# Patient Record
Sex: Male | Born: 1940
Health system: Southern US, Community
[De-identification: ages and names within clinical notes are randomized; demographics above are authoritative.]

## PROBLEM LIST (undated history)

## (undated) DIAGNOSIS — C801 Malignant (primary) neoplasm, unspecified: Secondary | ICD-10-CM

## (undated) DIAGNOSIS — H9190 Unspecified hearing loss, unspecified ear: Secondary | ICD-10-CM

## (undated) DIAGNOSIS — I998 Other disorder of circulatory system: Secondary | ICD-10-CM

---

## 2008-02-21 ENCOUNTER — Inpatient Hospital Stay (HOSPITAL_COMMUNITY): Admission: AD | Admit: 2008-02-21 | Discharge: 2008-03-01 | Payer: Self-pay | Admitting: Gastroenterology

## 2008-02-24 ENCOUNTER — Encounter (INDEPENDENT_AMBULATORY_CARE_PROVIDER_SITE_OTHER): Payer: Self-pay | Admitting: Surgery

## 2008-03-01 ENCOUNTER — Ambulatory Visit: Payer: Self-pay | Admitting: Hematology and Oncology

## 2008-03-08 LAB — CBC WITH DIFFERENTIAL/PLATELET
BASO%: 1.4 % (ref 0.0–2.0)
EOS%: 4.7 % (ref 0.0–7.0)
HCT: 39.4 % (ref 38.7–49.9)
LYMPH%: 39.8 % (ref 14.0–48.0)
MCH: 32.1 pg (ref 28.0–33.4)
MCHC: 32.9 g/dL (ref 32.0–35.9)
MCV: 97.7 fL (ref 81.6–98.0)
MONO#: 0.5 10*3/uL (ref 0.1–0.9)
MONO%: 9.5 % (ref 0.0–13.0)
NEUT%: 44.6 % (ref 40.0–75.0)
Platelets: 222 10*3/uL (ref 145–400)
RBC: 4.03 10*6/uL — ABNORMAL LOW (ref 4.20–5.71)
WBC: 5 10*3/uL (ref 4.0–10.0)

## 2008-03-08 LAB — COMPREHENSIVE METABOLIC PANEL
ALT: 34 U/L (ref 0–53)
Alkaline Phosphatase: 157 U/L — ABNORMAL HIGH (ref 39–117)
CO2: 23 mEq/L (ref 19–32)
Creatinine, Ser: 1.24 mg/dL (ref 0.40–1.50)
Sodium: 144 mEq/L (ref 135–145)
Total Bilirubin: 0.5 mg/dL (ref 0.3–1.2)
Total Protein: 6.8 g/dL (ref 6.0–8.3)

## 2008-03-08 LAB — CEA: CEA: 1.8 ng/mL (ref 0.0–5.0)

## 2008-07-02 ENCOUNTER — Ambulatory Visit: Payer: Self-pay | Admitting: Hematology and Oncology

## 2008-12-09 ENCOUNTER — Inpatient Hospital Stay (HOSPITAL_COMMUNITY): Admission: EM | Admit: 2008-12-09 | Discharge: 2008-12-11 | Payer: Self-pay | Admitting: Emergency Medicine

## 2009-02-12 ENCOUNTER — Encounter: Payer: Self-pay | Admitting: Internal Medicine

## 2009-02-12 ENCOUNTER — Ambulatory Visit: Payer: Self-pay | Admitting: Internal Medicine

## 2009-02-12 ENCOUNTER — Inpatient Hospital Stay (HOSPITAL_COMMUNITY): Admission: EM | Admit: 2009-02-12 | Discharge: 2009-02-27 | Payer: Self-pay | Admitting: Emergency Medicine

## 2009-02-14 ENCOUNTER — Encounter: Payer: Self-pay | Admitting: Internal Medicine

## 2009-02-14 ENCOUNTER — Encounter (INDEPENDENT_AMBULATORY_CARE_PROVIDER_SITE_OTHER): Payer: Self-pay | Admitting: Neurosurgery

## 2009-02-26 ENCOUNTER — Ambulatory Visit: Payer: Self-pay | Admitting: Physical Medicine & Rehabilitation

## 2009-02-27 ENCOUNTER — Inpatient Hospital Stay (HOSPITAL_COMMUNITY)
Admission: RE | Admit: 2009-02-27 | Discharge: 2009-03-15 | Payer: Self-pay | Admitting: Physical Medicine & Rehabilitation

## 2009-03-02 ENCOUNTER — Ambulatory Visit: Payer: Self-pay | Admitting: Physical Medicine & Rehabilitation

## 2009-03-18 ENCOUNTER — Encounter
Admission: RE | Admit: 2009-03-18 | Discharge: 2009-06-16 | Payer: Self-pay | Admitting: Physical Medicine & Rehabilitation

## 2009-03-19 ENCOUNTER — Emergency Department (HOSPITAL_COMMUNITY): Admission: EM | Admit: 2009-03-19 | Discharge: 2009-03-19 | Payer: Self-pay | Admitting: Family Medicine

## 2009-04-03 DIAGNOSIS — I6200 Nontraumatic subdural hemorrhage, unspecified: Secondary | ICD-10-CM

## 2009-04-03 DIAGNOSIS — C189 Malignant neoplasm of colon, unspecified: Secondary | ICD-10-CM | POA: Insufficient documentation

## 2009-04-03 DIAGNOSIS — H5702 Anisocoria: Secondary | ICD-10-CM

## 2009-04-03 DIAGNOSIS — I498 Other specified cardiac arrhythmias: Secondary | ICD-10-CM

## 2009-04-03 DIAGNOSIS — F1010 Alcohol abuse, uncomplicated: Secondary | ICD-10-CM | POA: Insufficient documentation

## 2009-04-04 ENCOUNTER — Ambulatory Visit: Payer: Self-pay | Admitting: Internal Medicine

## 2009-04-04 DIAGNOSIS — R609 Edema, unspecified: Secondary | ICD-10-CM

## 2009-04-17 ENCOUNTER — Encounter
Admission: RE | Admit: 2009-04-17 | Discharge: 2009-07-16 | Payer: Self-pay | Admitting: Physical Medicine & Rehabilitation

## 2009-04-19 ENCOUNTER — Ambulatory Visit: Payer: Self-pay | Admitting: Physical Medicine & Rehabilitation

## 2009-07-09 ENCOUNTER — Encounter
Admission: RE | Admit: 2009-07-09 | Discharge: 2009-07-09 | Payer: Self-pay | Admitting: Physical Medicine & Rehabilitation

## 2010-10-19 ENCOUNTER — Encounter: Payer: Self-pay | Admitting: Hematology and Oncology

## 2010-12-25 IMAGING — CT CT HEAD W/O CM
1 of 2 series · 13 of 30 positions shown, 17 images · non-contrast
Comparison: None

CLINICAL DATA: Headache and weakness.

CT HEAD WITHOUT CONTRAST
TECHNIQUE: Contiguous axial images were obtained from the base of
the skull through the vertex without contrast.

[Series 2: brain · axial · 0.47mm/px · z∈[+168,+298]mm · 13 of 44 slices shown, 17 images]
[im 4/44  brain]
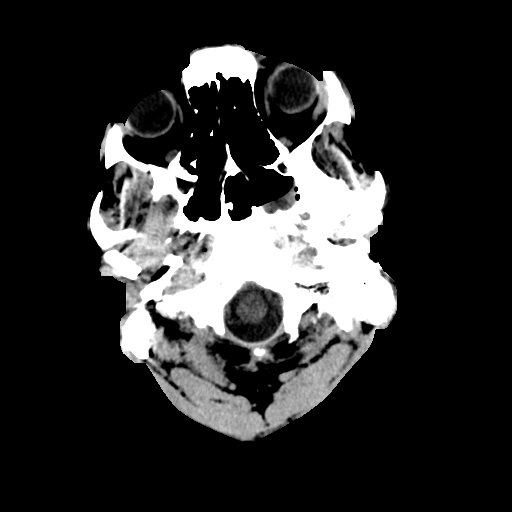
[im 4/44  bone]
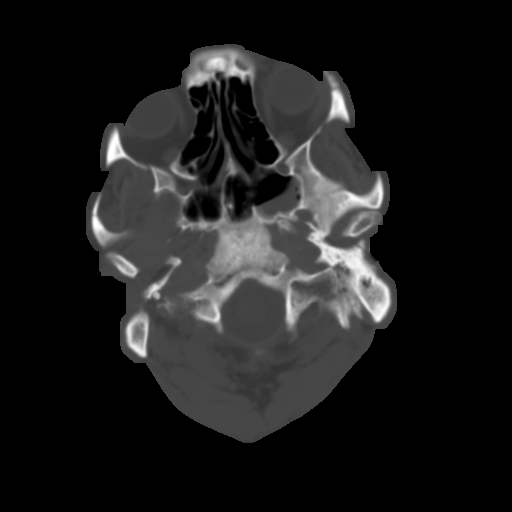
[im 7/44  brain]
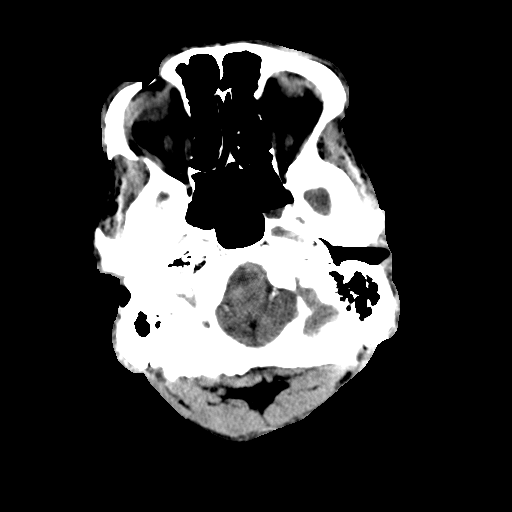
[im 10/44  brain]
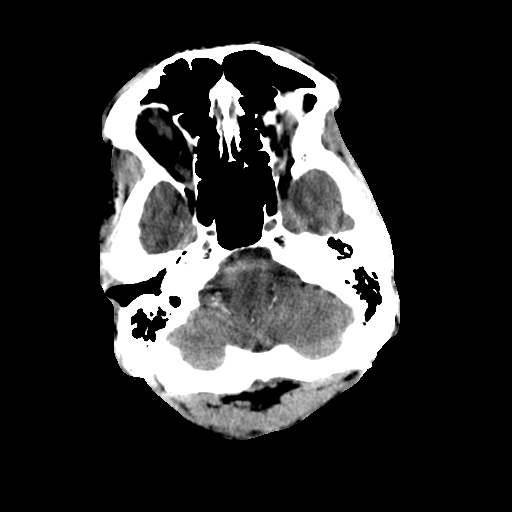
[im 13/44  brain]
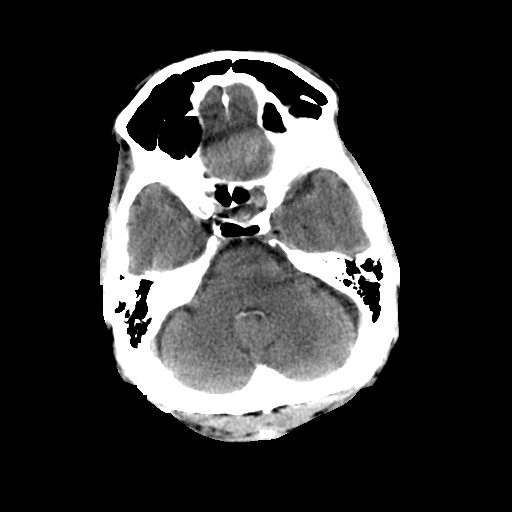
[im 16/44  brain]
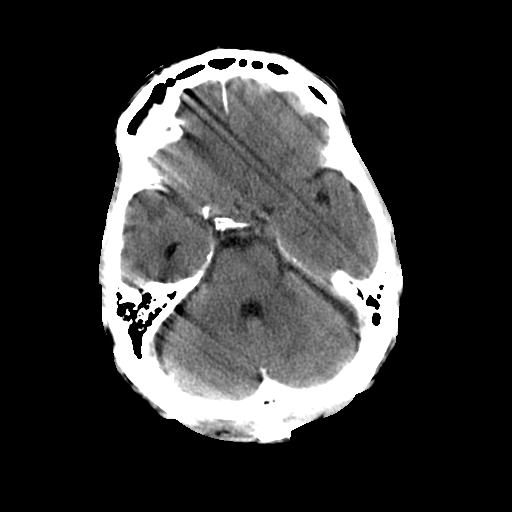
[im 16/44  bone]
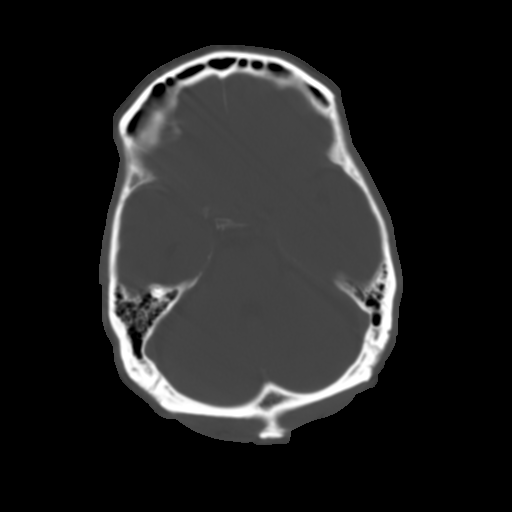
[im 19/44  brain]
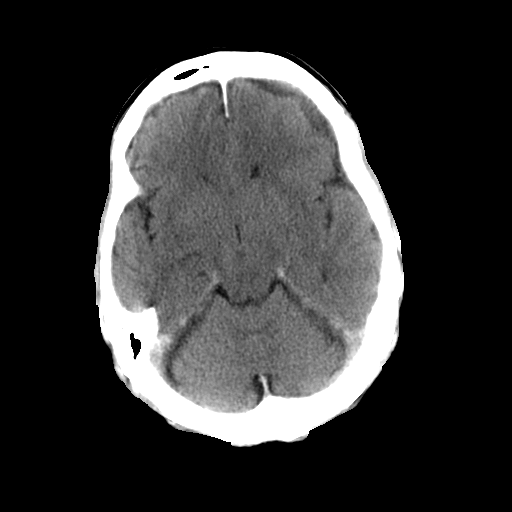
[im 22/44  brain]
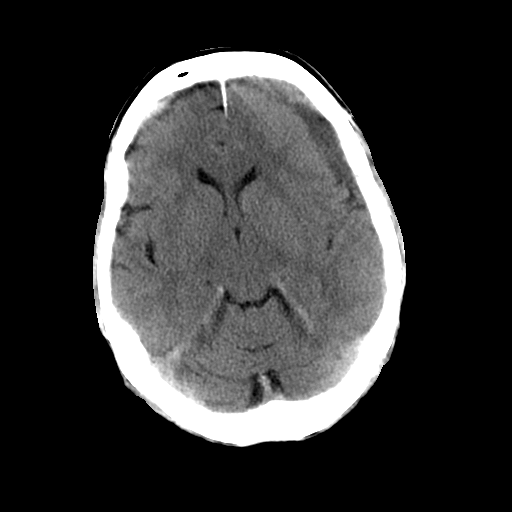
[im 25/44  brain]
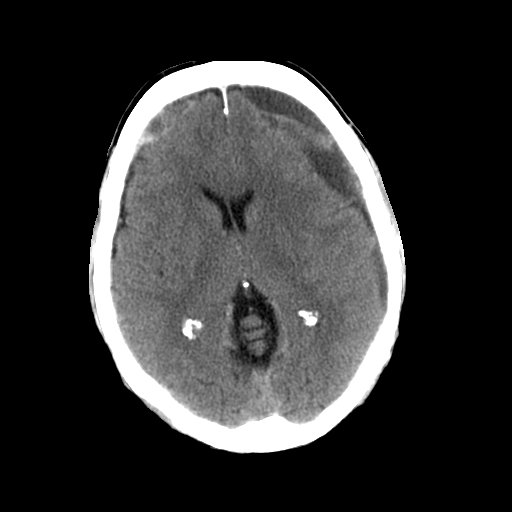
[im 28/44  brain]
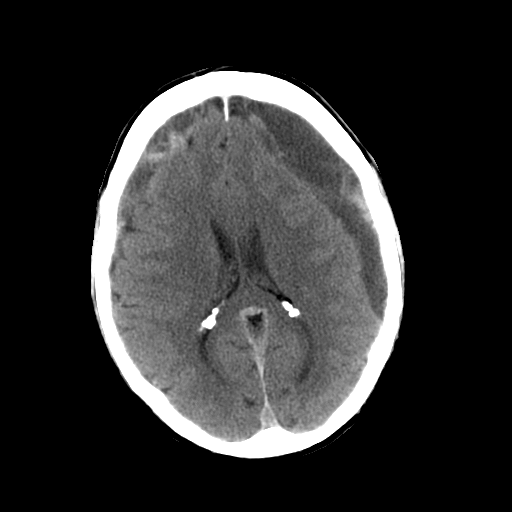
[im 28/44  bone]
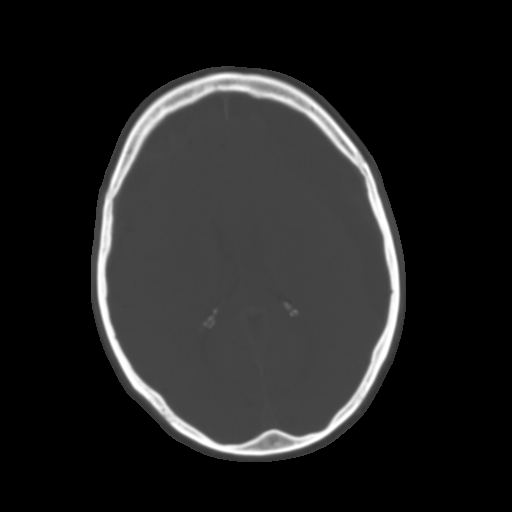
[im 31/44  brain]
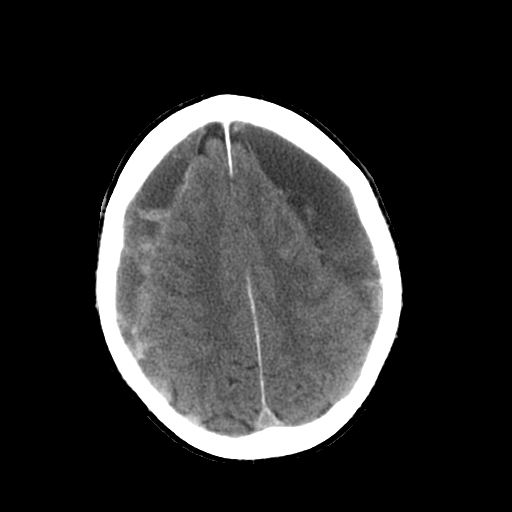
[im 34/44  brain]
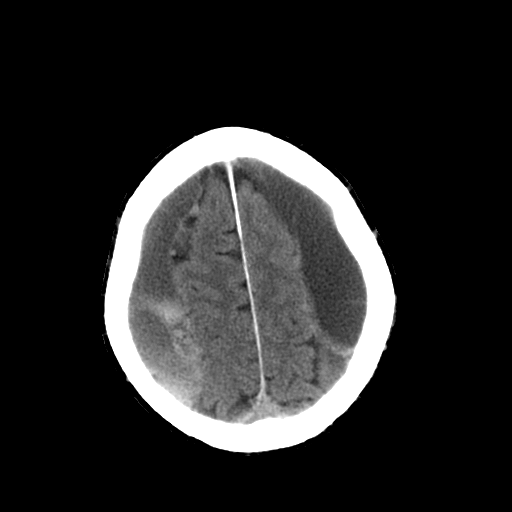
[im 37/44  brain]
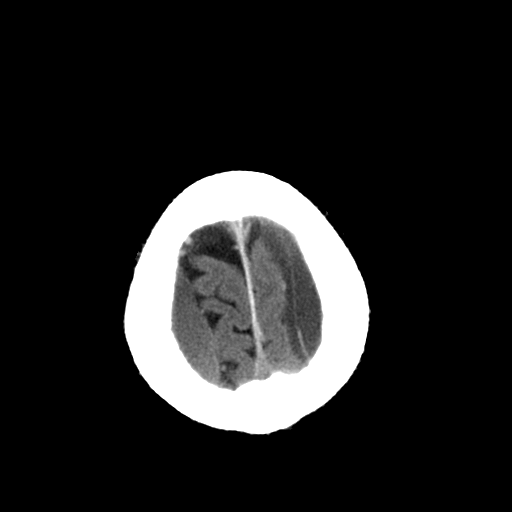
[im 40/44  brain]
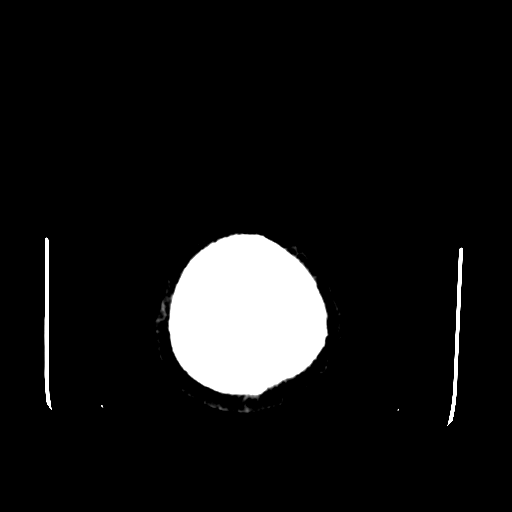
[im 40/44  bone]
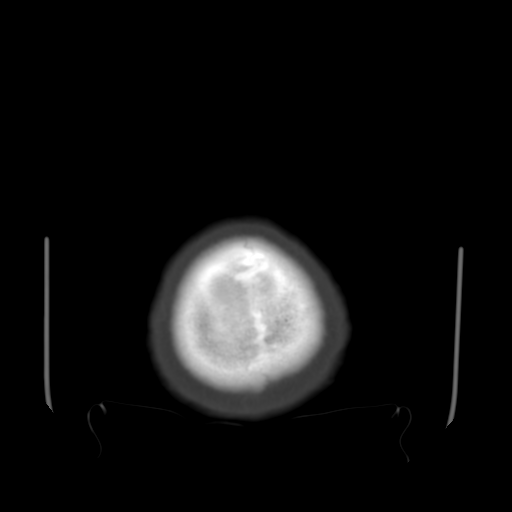

[13 of 30 positions shown; findings below may reference images not displayed]

FINDINGS: There are large bilateral subdural fluid collections with
remote, subacute and acute blood.  There is fairly equal
compression of the brain.  The ventricles are in the midline and
are somewhat compressed for age.  The sulci are also compressed.
There is no downward transtentorial herniation with CSF spaces
around the brainstem maintained.  No intracranial mass lesions.

The bony calvarium is intact.  The visualized paranasal sinuses
mastoid air cells are clear except for a small amount of fluid in
the left half of the sphenoid sinus.

There is a fracture of the right zygoma noted.  This was noted
along with multiple other facial bone fractures on the previous
maxillofacial CT.
IMPRESSION: Large bilateral subdural hematomas with differing ages of blood.
These were not present on the prior maxillofacial CT from
12/09/2008. Fairly equal compression of the ventricles bilaterally.
No downward transtentorial herniation.

## 2010-12-31 IMAGING — CT CT HEAD W/O CM
1 of 2 series · 13 of 30 positions shown, 17 images · non-contrast
Comparison: Head CT scan 02/12/2009.

CLINICAL DATA: Bilateral subdural hematomas.

CT HEAD WITHOUT CONTRAST
TECHNIQUE: Contiguous axial images were obtained from the base of
the skull through the vertex without contrast.

[Series 2: brain · axial · 0.49mm/px · z∈[+123,+255]mm · 13 of 28 slices shown, 17 images]
[im 2/28  brain]
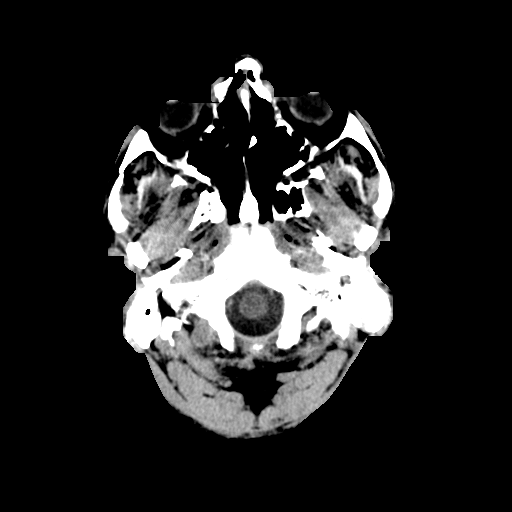
[im 2/28  bone]
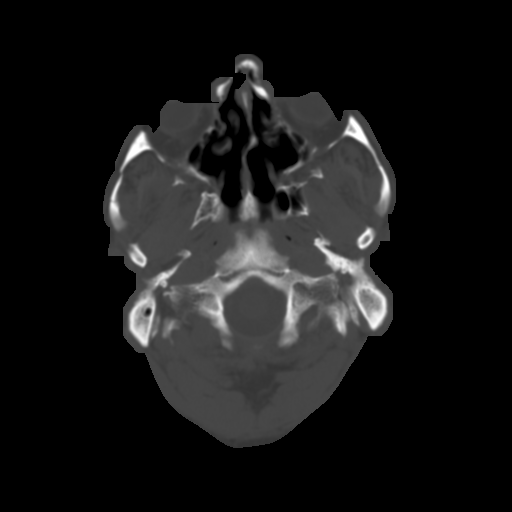
[im 4/28  brain]
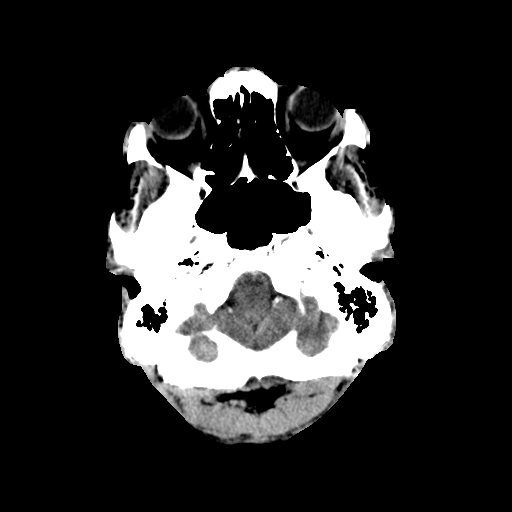
[im 6/28  brain]
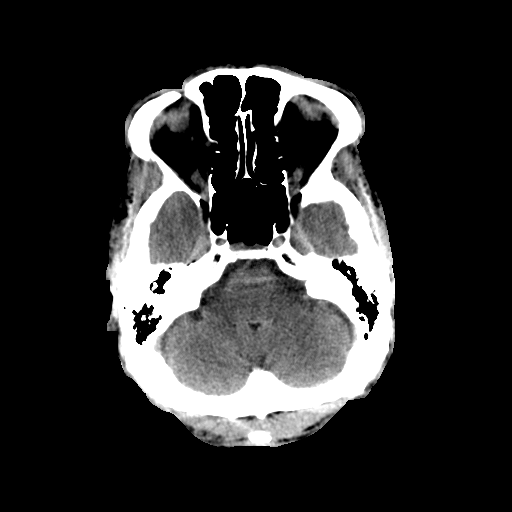
[im 8/28  brain]
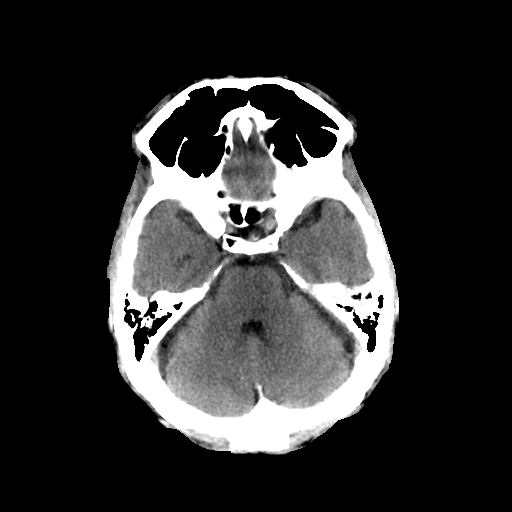
[im 10/28  brain]
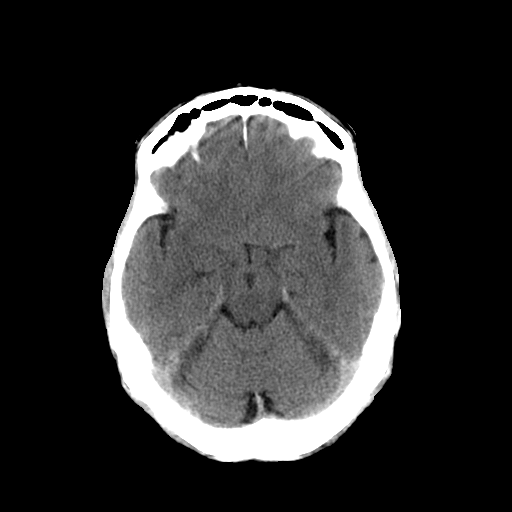
[im 10/28  bone]
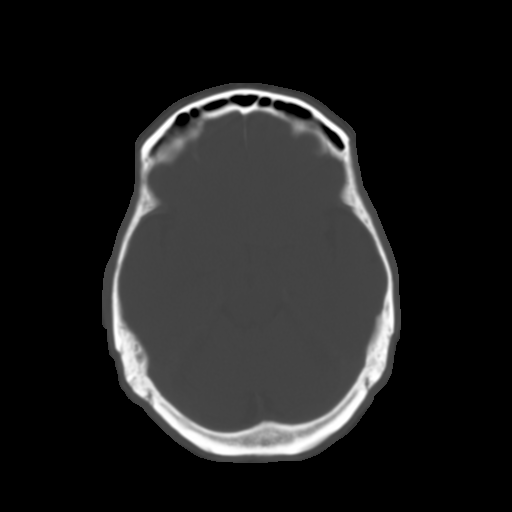
[im 12/28  brain]
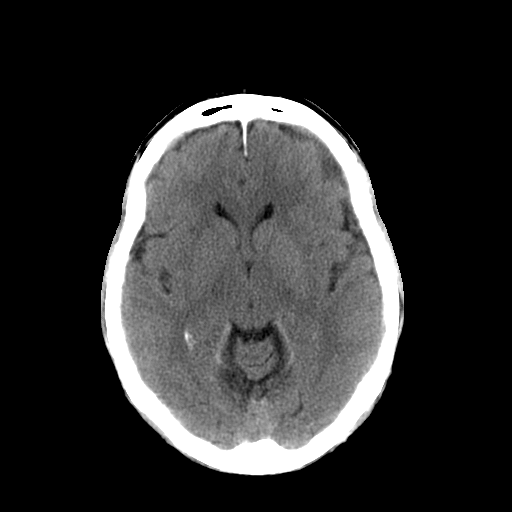
[im 14/28  brain]
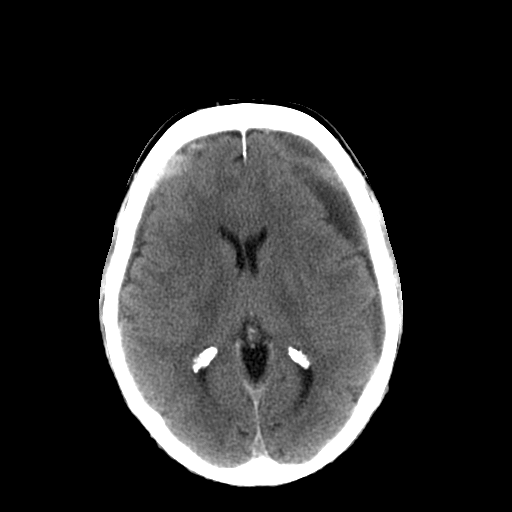
[im 16/28  brain]
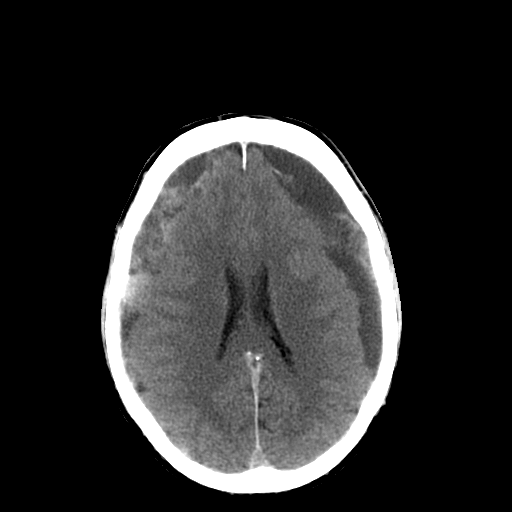
[im 18/28  brain]
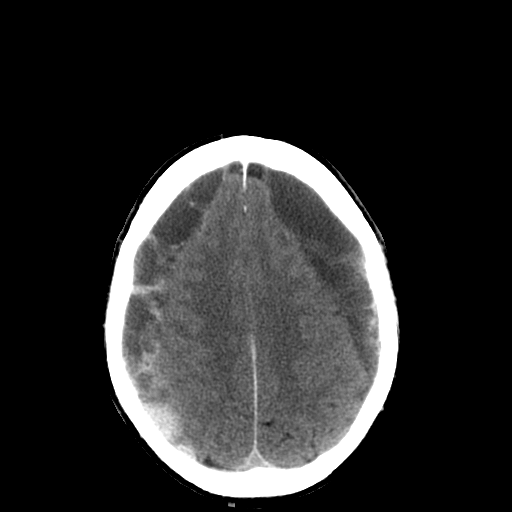
[im 18/28  bone]
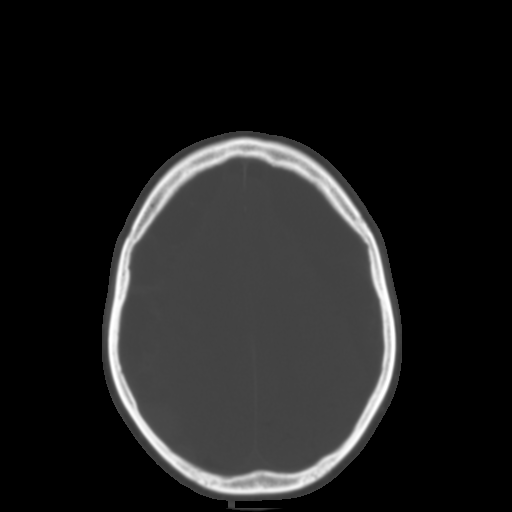
[im 20/28  brain]
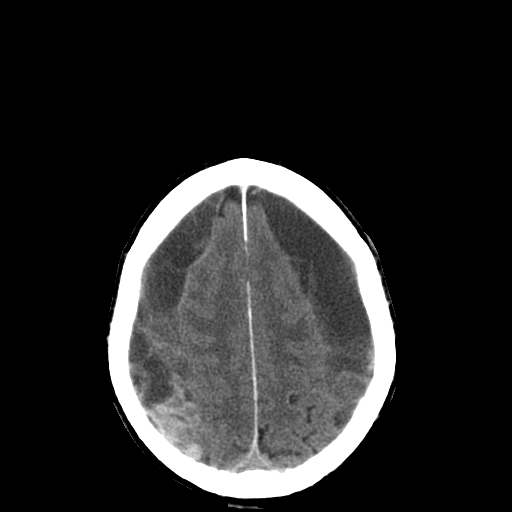
[im 22/28  brain]
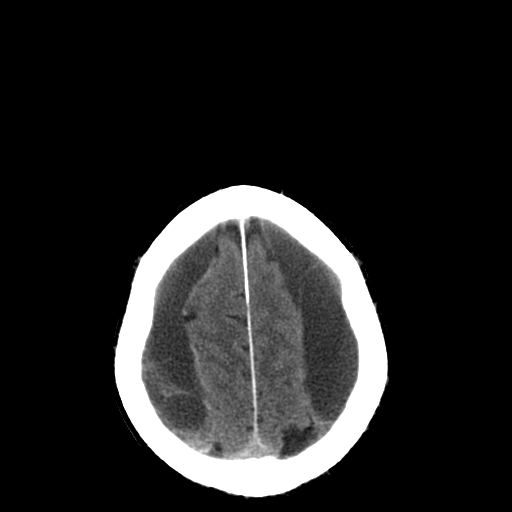
[im 24/28  brain]
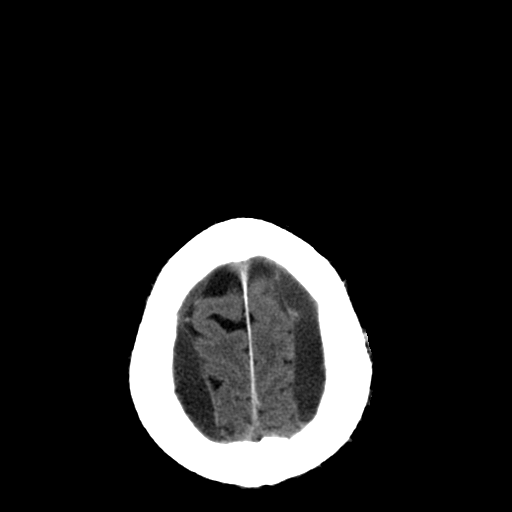
[im 26/28  brain]
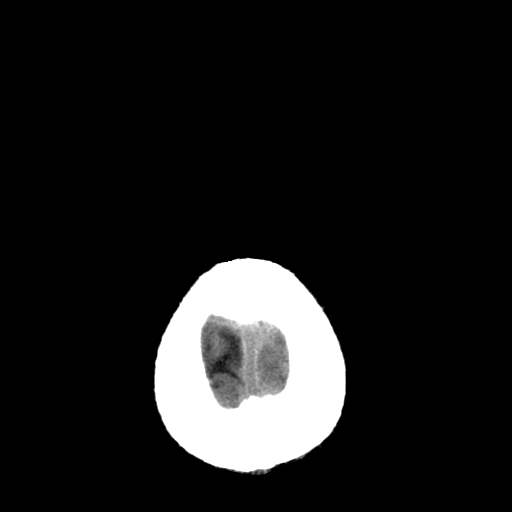
[im 26/28  bone]
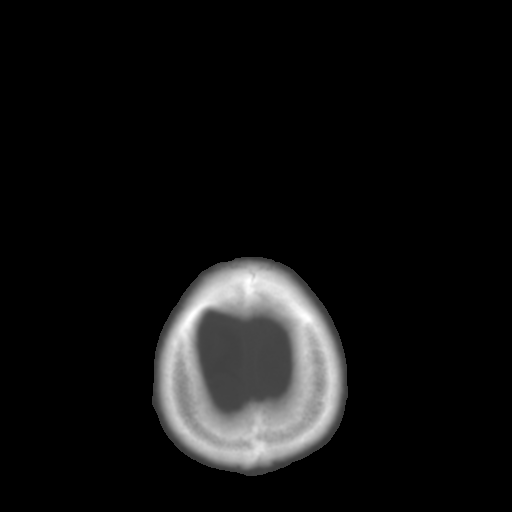

[13 of 30 positions shown; findings below may reference images not displayed]

FINDINGS: Large bilateral subdural collections with both low and
high attenuation components are again seen.  Volume of high
attenuation material appears greater on the right although the left-
sided collection appears slightly larger overall.  The collections
do not appear markedly changed in size and create mass effect in
both cerebral hemispheres.  No midline shift or hydrocephalus.
Basal cisterns remain open.  Facial fractures again noted.
IMPRESSION: No marked change in large bilateral acute on chronic subdural
hematomas.

## 2010-12-31 IMAGING — CR DG CHEST 1V PORT
1 series · 1 of 1 positions shown · non-contrast
Comparison: 12/10/2008

CLINICAL DATA: Central line placement.  Bilateral subdural
hematomas.

PORTABLE CHEST - 1 VIEW

[AP]
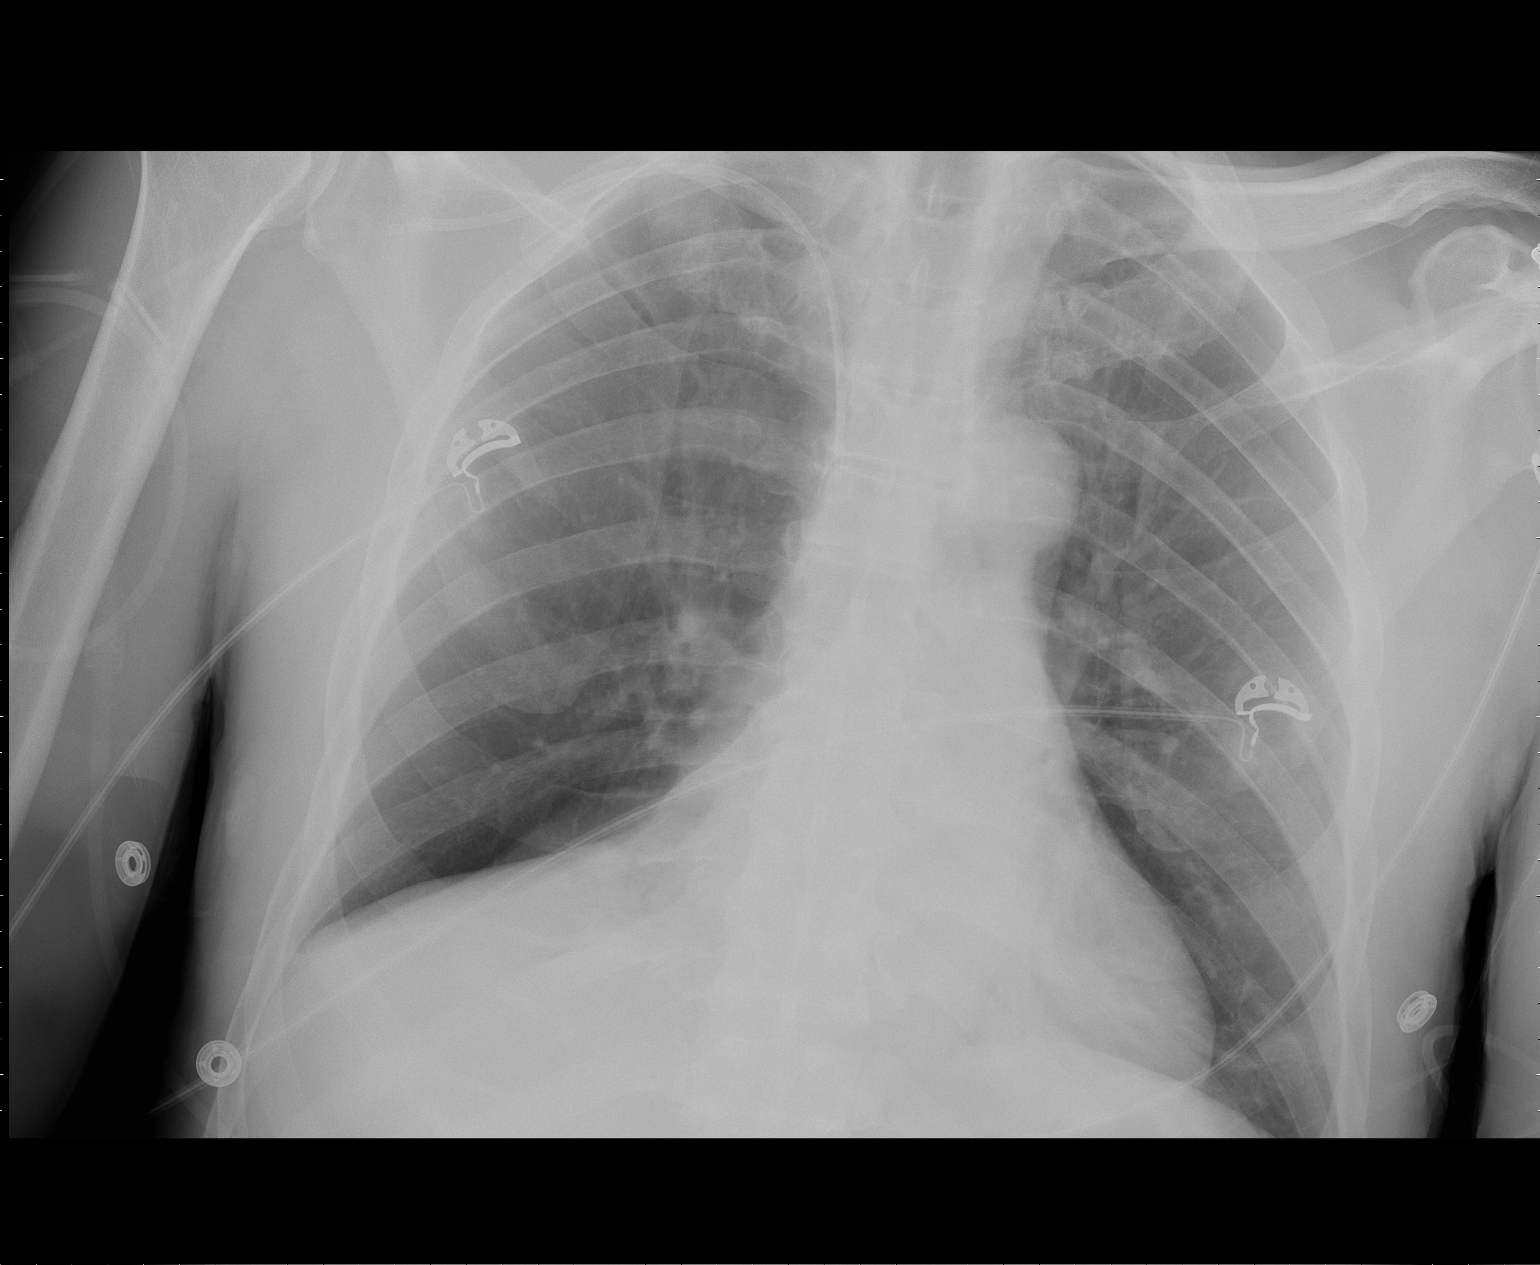

[1 of 1 positions shown; findings below may reference images not displayed]

FINDINGS: The right subclavian central venous catheter tip is in
the mid SVC.  No complicating features such as a pneumothorax.
Right middle lobe atelectasis is suspected.  No edema or effusions.
IMPRESSION: 1.  Right subclavian central venous catheter in good position
without complicating features.
2.  Probable right middle lobe atelectasis.

## 2011-01-05 LAB — COMPREHENSIVE METABOLIC PANEL
ALT: 71 U/L — ABNORMAL HIGH (ref 0–53)
Alkaline Phosphatase: 247 U/L — ABNORMAL HIGH (ref 39–117)
CO2: 27 mEq/L (ref 19–32)
Chloride: 106 mEq/L (ref 96–112)
GFR calc non Af Amer: >60 mL/min (ref >60)
Glucose, Bld: 108 mg/dL — ABNORMAL HIGH (ref 70–99)
Potassium: 4 mEq/L (ref 3.5–5.1)
Sodium: 141 mEq/L (ref 135–145)
Total Bilirubin: 0.8 mg/dL (ref 0.3–1.2)

## 2011-01-05 LAB — DIFFERENTIAL
Basophils Absolute: 0.1 10*3/uL (ref 0.0–0.1)
Basophils Relative: 1 % (ref 0–1)
Eosinophils Absolute: 0.2 10*3/uL (ref 0.0–0.7)
Eosinophils Relative: 1 % (ref 0–5)
Eosinophils Relative: 3 % (ref 0–5)
Lymphocytes Relative: 12 % (ref 12–46)
Lymphocytes Relative: 19 % (ref 12–46)
Lymphs Abs: 1.3 10*3/uL (ref 0.7–4.0)
Lymphs Abs: 1.5 10*3/uL (ref 0.7–4.0)
Monocytes Absolute: 1.1 10*3/uL — ABNORMAL HIGH (ref 0.1–1.0)
Monocytes Relative: 14 % — ABNORMAL HIGH (ref 3–12)
Neutro Abs: 5.1 10*3/uL (ref 1.7–7.7)
Neutro Abs: 8.9 10*3/uL — ABNORMAL HIGH (ref 1.7–7.7)
Neutrophils Relative %: 63 % (ref 43–77)

## 2011-01-05 LAB — BASIC METABOLIC PANEL
BUN: 12 mg/dL (ref 6–23)
CO2: 30 mEq/L (ref 19–32)
CO2: 27 mEq/L (ref 19–32)
Calcium: 10.3 mg/dL (ref 8.4–10.5)
Calcium: 10 mg/dL (ref 8.4–10.5)
Calcium: 10.1 mg/dL (ref 8.4–10.5)
Chloride: 111 mEq/L (ref 96–112)
Creatinine, Ser: 1.12 mg/dL (ref 0.4–1.5)
Creatinine, Ser: 1.03 mg/dL (ref 0.4–1.5)
Creatinine, Ser: 1.2 mg/dL (ref 0.4–1.5)
GFR calc Af Amer: >60 mL/min (ref >60)
GFR calc Af Amer: >60 mL/min (ref >60)
GFR calc non Af Amer: >60 mL/min (ref >60)
GFR calc non Af Amer: >60 mL/min (ref >60)
Glucose, Bld: 86 mg/dL (ref 70–99)
Glucose, Bld: 96 mg/dL (ref 70–99)
Potassium: 3.5 mEq/L (ref 3.5–5.1)
Sodium: 144 mEq/L (ref 135–145)
Sodium: 144 mEq/L (ref 135–145)

## 2011-01-05 LAB — CBC
HCT: 35 % — ABNORMAL LOW (ref 39.0–52.0)
HCT: 36.7 % — ABNORMAL LOW (ref 39.0–52.0)
Hemoglobin: 11.9 g/dL — ABNORMAL LOW (ref 13.0–17.0)
Hemoglobin: 12.3 g/dL — ABNORMAL LOW (ref 13.0–17.0)
Hemoglobin: 12.9 g/dL — ABNORMAL LOW (ref 13.0–17.0)
MCHC: 33.3 g/dL (ref 30.0–36.0)
MCHC: 33.4 g/dL (ref 30.0–36.0)
MCHC: 33.7 g/dL (ref 30.0–36.0)
MCV: 98.3 fL (ref 78.0–100.0)
MCV: 98.8 fL (ref 78.0–100.0)
Platelets: 196 10*3/uL (ref 150–400)
Platelets: 227 10*3/uL (ref 150–400)
Platelets: 264 10*3/uL (ref 150–400)
RBC: 3.57 MIL/uL — ABNORMAL LOW (ref 4.22–5.81)
RBC: 3.72 MIL/uL — ABNORMAL LOW (ref 4.22–5.81)
RBC: 3.89 MIL/uL — ABNORMAL LOW (ref 4.22–5.81)
RDW: 14.3 % (ref 11.5–15.5)
RDW: 14 % (ref 11.5–15.5)
WBC: 7.6 10*3/uL (ref 4.0–10.5)
WBC: 8.1 10*3/uL (ref 4.0–10.5)

## 2011-01-05 LAB — BRAIN NATRIURETIC PEPTIDE: Pro B Natriuretic peptide (BNP): <30 pg/mL (ref 0.0–100.0)

## 2011-01-05 LAB — URINALYSIS, ROUTINE W REFLEX MICROSCOPIC
Glucose, UA: NEGATIVE mg/dL
Hgb urine dipstick: NEGATIVE
Ketones, ur: 15 mg/dL — AB
pH: 5 (ref 5.0–8.0)

## 2011-01-05 LAB — URINE CULTURE
Colony Count: NO GROWTH
Culture: NO GROWTH

## 2011-01-05 LAB — POCT I-STAT, CHEM 8
Calcium, Ion: 1.34 mmol/L — ABNORMAL HIGH (ref 1.12–1.32)
HCT: 38 % — ABNORMAL LOW (ref 39.0–52.0)
TCO2: 30 mmol/L (ref 0–100)

## 2011-01-05 LAB — PREALBUMIN: Prealbumin: 20.4 mg/dL (ref 18.0–45.0)

## 2011-01-06 LAB — BASIC METABOLIC PANEL
BUN: 10 mg/dL (ref 6–23)
BUN: 10 mg/dL (ref 6–23)
BUN: 11 mg/dL (ref 6–23)
BUN: 9 mg/dL (ref 6–23)
CO2: 20 mEq/L (ref 19–32)
CO2: 20 mEq/L (ref 19–32)
CO2: 21 mEq/L (ref 19–32)
CO2: 23 mEq/L (ref 19–32)
Calcium: 9 mg/dL (ref 8.4–10.5)
Calcium: 9.1 mg/dL (ref 8.4–10.5)
Calcium: 9.4 mg/dL (ref 8.4–10.5)
Calcium: 9.8 mg/dL (ref 8.4–10.5)
Chloride: 108 mEq/L (ref 96–112)
Chloride: 111 mEq/L (ref 96–112)
Chloride: 111 mEq/L (ref 96–112)
Chloride: 112 mEq/L (ref 96–112)
Creatinine, Ser: 1.04 mg/dL (ref 0.4–1.5)
Creatinine, Ser: 1.05 mg/dL (ref 0.4–1.5)
Creatinine, Ser: 1.11 mg/dL (ref 0.4–1.5)
Creatinine, Ser: 1.24 mg/dL (ref 0.4–1.5)
GFR calc Af Amer: >60 mL/min (ref >60)
GFR calc Af Amer: >60 mL/min (ref >60)
GFR calc Af Amer: >60 mL/min (ref >60)
GFR calc Af Amer: >60 mL/min (ref >60)
GFR calc non Af Amer: 58 mL/min — ABNORMAL LOW (ref >60)
GFR calc non Af Amer: >60 mL/min (ref >60)
GFR calc non Af Amer: >60 mL/min (ref >60)
GFR calc non Af Amer: >60 mL/min (ref >60)
Glucose, Bld: 106 mg/dL — ABNORMAL HIGH (ref 70–99)
Glucose, Bld: 124 mg/dL — ABNORMAL HIGH (ref 70–99)
Glucose, Bld: 85 mg/dL (ref 70–99)
Glucose, Bld: 96 mg/dL (ref 70–99)
Potassium: 3.6 mEq/L (ref 3.5–5.1)
Potassium: 3.6 mEq/L (ref 3.5–5.1)
Potassium: 4.4 mEq/L (ref 3.5–5.1)
Potassium: 4.5 mEq/L (ref 3.5–5.1)
Sodium: 138 mEq/L (ref 135–145)
Sodium: 140 mEq/L (ref 135–145)
Sodium: 140 mEq/L (ref 135–145)
Sodium: 141 mEq/L (ref 135–145)

## 2011-01-06 LAB — DIFFERENTIAL
Basophils Absolute: 0 10*3/uL (ref 0.0–0.1)
Basophils Absolute: 0 10*3/uL (ref 0.0–0.1)
Basophils Absolute: 0 10*3/uL (ref 0.0–0.1)
Basophils Absolute: 0 10*3/uL (ref 0.0–0.1)
Basophils Relative: 0 % (ref 0–1)
Basophils Relative: 0 % (ref 0–1)
Basophils Relative: 0 % (ref 0–1)
Basophils Relative: 1 % (ref 0–1)
Basophils Relative: 1 % (ref 0–1)
Eosinophils Absolute: 0 10*3/uL (ref 0.0–0.7)
Eosinophils Absolute: 0 10*3/uL (ref 0.0–0.7)
Eosinophils Absolute: 0.1 10*3/uL (ref 0.0–0.7)
Eosinophils Absolute: 0.2 10*3/uL (ref 0.0–0.7)
Eosinophils Absolute: 0.2 10*3/uL (ref 0.0–0.7)
Eosinophils Relative: 0 % (ref 0–5)
Eosinophils Relative: 0 % (ref 0–5)
Eosinophils Relative: 2 % (ref 0–5)
Eosinophils Relative: 2 % (ref 0–5)
Lymphocytes Relative: 15 % (ref 12–46)
Lymphocytes Relative: 29 % (ref 12–46)
Lymphocytes Relative: 4 % — ABNORMAL LOW (ref 12–46)
Lymphocytes Relative: 6 % — ABNORMAL LOW (ref 12–46)
Lymphs Abs: 0.4 10*3/uL — ABNORMAL LOW (ref 0.7–4.0)
Lymphs Abs: 0.9 10*3/uL (ref 0.7–4.0)
Lymphs Abs: 1.3 10*3/uL (ref 0.7–4.0)
Lymphs Abs: 1.7 10*3/uL (ref 0.7–4.0)
Lymphs Abs: 1.7 10*3/uL (ref 0.7–4.0)
Monocytes Absolute: 0.6 10*3/uL (ref 0.1–1.0)
Monocytes Absolute: 0.7 10*3/uL (ref 0.1–1.0)
Monocytes Absolute: 1.1 10*3/uL — ABNORMAL HIGH (ref 0.1–1.0)
Monocytes Absolute: 1.4 10*3/uL — ABNORMAL HIGH (ref 0.1–1.0)
Monocytes Relative: 10 % (ref 3–12)
Monocytes Relative: 11 % (ref 3–12)
Monocytes Relative: 13 % — ABNORMAL HIGH (ref 3–12)
Monocytes Relative: 7 % (ref 3–12)
Monocytes Relative: 8 % (ref 3–12)
Neutro Abs: 11.7 10*3/uL — ABNORMAL HIGH (ref 1.7–7.7)
Neutro Abs: 3.1 10*3/uL (ref 1.7–7.7)
Neutro Abs: 3.4 10*3/uL (ref 1.7–7.7)
Neutro Abs: 5.8 10*3/uL (ref 1.7–7.7)
Neutro Abs: 9.3 10*3/uL — ABNORMAL HIGH (ref 1.7–7.7)
Neutrophils Relative %: 57 % (ref 43–77)
Neutrophils Relative %: 58 % (ref 43–77)
Neutrophils Relative %: 69 % (ref 43–77)
Neutrophils Relative %: 84 % — ABNORMAL HIGH (ref 43–77)
Neutrophils Relative %: 89 % — ABNORMAL HIGH (ref 43–77)

## 2011-01-06 LAB — CBC
HCT: 35 % — ABNORMAL LOW (ref 39.0–52.0)
HCT: 38.4 % — ABNORMAL LOW (ref 39.0–52.0)
HCT: 39.3 % (ref 39.0–52.0)
HCT: 39.7 % (ref 39.0–52.0)
Hemoglobin: 11.7 g/dL — ABNORMAL LOW (ref 13.0–17.0)
Hemoglobin: 13.1 g/dL (ref 13.0–17.0)
Hemoglobin: 13.4 g/dL (ref 13.0–17.0)
Hemoglobin: 13.5 g/dL (ref 13.0–17.0)
MCHC: 33.4 g/dL (ref 30.0–36.0)
MCHC: 34 g/dL (ref 30.0–36.0)
MCHC: 34.1 g/dL (ref 30.0–36.0)
MCHC: 34.2 g/dL (ref 30.0–36.0)
MCV: 98.1 fL (ref 78.0–100.0)
MCV: 98.2 fL (ref 78.0–100.0)
MCV: 98.7 fL (ref 78.0–100.0)
MCV: 98.9 fL (ref 78.0–100.0)
Platelets: 150 10*3/uL (ref 150–400)
Platelets: 151 10*3/uL (ref 150–400)
Platelets: 162 10*3/uL (ref 150–400)
Platelets: 165 10*3/uL (ref 150–400)
Platelets: 166 10*3/uL (ref 150–400)
RBC: 3.55 MIL/uL — ABNORMAL LOW (ref 4.22–5.81)
RBC: 3.91 MIL/uL — ABNORMAL LOW (ref 4.22–5.81)
RBC: 3.98 MIL/uL — ABNORMAL LOW (ref 4.22–5.81)
RBC: 4.04 MIL/uL — ABNORMAL LOW (ref 4.22–5.81)
RBC: 4.59 MIL/uL (ref 4.22–5.81)
RDW: 14.3 % (ref 11.5–15.5)
RDW: 14.4 % (ref 11.5–15.5)
RDW: 14.5 % (ref 11.5–15.5)
RDW: 14.7 % (ref 11.5–15.5)
WBC: 10.4 10*3/uL (ref 4.0–10.5)
WBC: 14 10*3/uL — ABNORMAL HIGH (ref 4.0–10.5)
WBC: 5.4 10*3/uL (ref 4.0–10.5)
WBC: 5.9 10*3/uL (ref 4.0–10.5)
WBC: 8.4 10*3/uL (ref 4.0–10.5)

## 2011-01-06 LAB — T4, FREE: Free T4: 0.85 ng/dL (ref 0.80–1.80)

## 2011-01-06 LAB — POCT I-STAT, CHEM 8
Calcium, Ion: 1.2 mmol/L (ref 1.12–1.32)
Chloride: 111 mEq/L (ref 96–112)
Glucose, Bld: 81 mg/dL (ref 70–99)
HCT: 47 % (ref 39.0–52.0)
Hemoglobin: 16 g/dL (ref 13.0–17.0)

## 2011-01-06 LAB — TYPE AND SCREEN: Antibody Screen: NEGATIVE

## 2011-01-06 LAB — HEMOGLOBIN A1C
Hgb A1c MFr Bld: 5 % (ref 4.6–6.1)
Mean Plasma Glucose: 97 mg/dL

## 2011-01-06 LAB — ABO/RH: ABO/RH(D): O POS

## 2011-01-06 LAB — PROTIME-INR
INR: 0.9 (ref 0.00–1.49)
Prothrombin Time: 12.3 seconds (ref 11.6–15.2)

## 2011-01-06 LAB — TSH: TSH: 1.361 u[IU]/mL (ref 0.350–4.500)

## 2011-01-06 LAB — APTT: aPTT: 26 seconds (ref 24–37)

## 2011-01-06 LAB — LIPID PANEL
Cholesterol: 193 mg/dL (ref 0–200)
HDL: 96 mg/dL (ref >39)

## 2011-01-08 LAB — BASIC METABOLIC PANEL
BUN: 12 mg/dL (ref 6–23)
Chloride: 108 mEq/L (ref 96–112)
Glucose, Bld: 96 mg/dL (ref 70–99)
Potassium: 3.5 mEq/L (ref 3.5–5.1)
Sodium: 139 mEq/L (ref 135–145)

## 2011-01-08 LAB — CBC
HCT: 32.8 % — ABNORMAL LOW (ref 39.0–52.0)
HCT: 36.3 % — ABNORMAL LOW (ref 39.0–52.0)
Hemoglobin: 11.2 g/dL — ABNORMAL LOW (ref 13.0–17.0)
Hemoglobin: 12.2 g/dL — ABNORMAL LOW (ref 13.0–17.0)
MCV: 99.1 fL (ref 78.0–100.0)
MCV: 99.2 fL (ref 78.0–100.0)
Platelets: 108 10*3/uL — ABNORMAL LOW (ref 150–400)
Platelets: 123 10*3/uL — ABNORMAL LOW (ref 150–400)
RDW: 14 % (ref 11.5–15.5)
WBC: 8.3 10*3/uL (ref 4.0–10.5)
WBC: 8.6 10*3/uL (ref 4.0–10.5)

## 2011-01-08 LAB — DIFFERENTIAL
Eosinophils Absolute: 0 10*3/uL (ref 0.0–0.7)
Eosinophils Relative: 0 % (ref 0–5)
Lymphocytes Relative: 7 % — ABNORMAL LOW (ref 12–46)
Lymphs Abs: 0.6 10*3/uL — ABNORMAL LOW (ref 0.7–4.0)
Monocytes Absolute: 0.7 10*3/uL (ref 0.1–1.0)

## 2011-01-08 LAB — APTT: aPTT: 29 seconds (ref 24–37)

## 2011-02-10 NOTE — Consult Note (Signed)
NAME:  JOMARI, BARTNIK                 ACCOUNT NO.:  [id number]   MEDICAL RECORD NO.:  [id number]          PATIENT TYPE:  INP   LOCATION:  5120                         FACILITY:  [facility]   PHYSICIAN:  Jordan Hawks. Elnoria Howard, MD    DATE OF BIRTH:  11-04-1940   DATE OF CONSULTATION:  02/21/2008  DATE OF DISCHARGE:                                 CONSULTATION   REASON FOR ADMISSION:  Postprocedural nausea and vomiting and a near  obstructing descending colon mass.   PRIMARY CARE Pavel Gadd:  Lucilla Edin, MD   HISTORY OF PRESENT ILLNESS:  This is a 69 year old gentleman who was  initially evaluated in the office with complaints of diarrhea after  discussion with his primary care Andree Heeg.  Upon further evaluation in  the office, it was felt that he had overflow incontinence secondary to  malignancy as he remained persistently heme-positive.  Interestingly,  his hemoglobin remained stable even upon repeat check in the 15 range.  The patient subsequently underwent a colonoscopy on Feb 21, 2008, and  during the examination, he was noted to have a near obstructing  circumferential mass in the descending colon.  With gentle maneuvering,  the colonoscope was able to be moved through the mass, but it was  difficult to determine the exact length because of the patient's  movements and coughing.  However, the distal site of the colon cancer  was tattooed with methylene blue.  Because of this mass as well as  findings of multiple colonic polyps, which were all removed, the  procedure was prolonged.  Subsequently after the colonoscope was  withdrawn, he had a significant amount of nausea and vomiting, which  continued to persist despite observation within postprocedure lay.  Because of his persistent symptoms, he was subsequently transferred over  to the hospital for further evaluation and treatment.  After his arrival  to the hospital, it appears that his symptoms have markedly improved in  regards to the  nausea and vomiting.  However, further workup and  treatment will be pursued at this time.   PAST MEDICAL AND SURGICAL HISTORY:  Significant for hemorrhoids and  status post hemorrhoidectomy.   HOME MEDICATIONS:  None.   FAMILY HISTORY:  Noncontributory.   SOCIAL HISTORY:  Positive for one pack per day and also alcohol use of  approximately a fifth of liquor per day.   ALLERGIES:  No known drug allergies.   REVIEW OF SYSTEMS:  As stated above, in history present illnesses,  otherwise, negative.   PHYSICAL EXAMINATION:  VITAL SIGNS:  Stable.  GENERAL:  The patient is in no acute distress, alert and oriented.  HEENT:  Normocephalic and atraumatic.  Extraocular muscles intact.  NECK:  Supple.  No lymphadenopathy.  LUNGS:  Clear to auscultation bilaterally.  CARDIOVASCULAR:  Regular rate and rhythm.  ABDOMEN:  Flat, soft, nontender, and nondistended.  Positive bowel  sounds.  EXTREMITIES:  No clubbing, cyanosis, or edema.   Laboratory values are pending at this time.   IMPRESSION:  1. Near obstructing descending colon mass, most likely a colon cancer.  2. Status  post polypectomy.  3. Alcohol abuse.   PLAN:  1. At this time is to pursue further workup and treatment for his      colonic mass.  He appears to be back at his baseline at this time,      but in light of the gravity of the current findings, aggressive      intervention is required.  He has a significant alcohol history,      and he will need to be prophylaxed for DTs.  Plan is to request a      surgical consultation.  2. Prophylaxed with Ativan 1-2 mg q.2-4 h. p.r.n. IV and a CT scan of      the abdomen and pelvis with IV and p.o. contrast and await CEA      level.      Jordan Hawks Elnoria Howard, MD  Electronically Signed     PDH/MEDQ  D:  02/21/2008  T:  02/22/2008  Job:  [id number]   cc:   Brett Canales A. Cleta Alberts, M.D.
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
## 2011-02-10 NOTE — Discharge Summary (Signed)
NAME:  Eddie Hodge, Eddie Hodge                 ACCOUNT NO.:  [id number]   MEDICAL RECORD NO.:  [id number]          PATIENT TYPE:  INP   LOCATION:  5153                         FACILITY:  [facility]   PHYSICIAN:  Eddie Hodge, M.D.DATE OF BIRTH:  [birth year]   DATE OF ADMISSION:  02/21/2008  DATE OF DISCHARGE:  03/01/2008                               DISCHARGE SUMMARY   PROCEDURE:  Partial colectomy with resection of distal transverse colon,  splenic flexure, descending colon, proximal sigmoid colon with a primary  anastomosis on Feb 24, 2008.   CONSULTANTS:  Eddie A. Cornett, MD with [facility].   REASON FOR ADMISSION:  Mr. Eddie Hodge is a 70 year old black male who  presented to Dr. Haywood Hodge office with a complaint of approximately 3  months of constipation.  He stated that the past several weeks, he has  noticed diarrhea as well.  He also noted some hematochezia in the toilet  as well as on the tissue paper and mixed in the stool.  He also states  that he had been also having abdominal pain after eating.  Because of  these symptoms, Dr. Elnoria Hodge felt the patient would benefit from a  colonoscopy.  Therefore, Dr. Elnoria Hodge performed the colonoscopy in which a  large near-obstructing colon mass was found.  At this time, he took  several biopsies of this as well as biopsies of various other polyps  that he found throughout the colon.  At this time, he was admitted to  the hospital for eventual surgical resection of this colon mass.  Please  see dictated history and physical for further information.   ADMITTING DIAGNOSES:  1. Near-obstructing colon mass.  2. History of heavy alcohol use.  3. Diarrhea.  4. Constipation.   HOSPITAL COURSE:  After the patient was admitted, he was put on clear  liquid diet.  Over the next 2 days, the patient was continued on a clear  liquid diet while pathology results were pending.  At this time, we did  not feel comfortable going in to resect mass with the  pathology pending  due to the possibility of the various other  polyps that were biopsied  coming back positive for cancer as well.  On hospital day 2, pathology  came back showing adenocarcinoma of the large near-obstructing mass,  however, all the other polyps were benign.  Therefore on hospital day 3,  the patient was continuing to do well with decrease in pain and at this  time was n.p.o. since midnight and therefore at this time was taken to  the OR for an exploratory laparotomy with a partial colectomy with a  resection of the distal transverse colon, the splenic flexure, the  descending colon, and the proximal sigmoid colon with a primary  anastomosis.  The patient tolerated this procedure well.  Later that  night, the patient had pulled his NG tube and was refusing for it to be  put back in.  Therefore, on postoperative day 1, since the patient was  having active bowel sounds and was not complaining of any nausea, the  patient's  NG tube was left out and he was allowed to have ice chips with  sips of water.  On postoperative day 2, the patient continued without  complaint.  He was continued on ice chips until further bowel function  returned.  At this time, his abdomen was soft and mildly tender.  His  incision at this time was healing well with his staples still intact.  By postoperative day 3, the patient did not have any complaints.  He was  tolerating his ice chips and sips of clears and therefore he was  advanced to clear liquid diet.  Also by this day, his pathology had  returned which did confirm an adenocarcinoma of his colon.  On  postoperative day 4, the patient was tolerating his clear liquids and  had 2 bowel movements the day prior.  His abdomen was benign with his  incision being clean, dry, and intact with no erythema or drainage.  At  this time, his diet was advanced to full liquids.  By postoperative day  5, the patient was tolerating fluids and therefore was  advanced to  regular diet.  We had discussed the findings of the pathology with the  patient and at this time recommended outpatient followup with the  oncologist.  At this time, I contacted the [facility] in  which I have scheduled an appointment for the patient with Eddie Hodge to  followup for the patient's adenocarcinoma.  At this time, the patient is  considered a T3 N0 stage.  On postoperative day 6, the patient was  tolerating a diet and feeling well.  Therefore, he was stable for  discharge.   DISCHARGE DIAGNOSES:  1. Adenocarcinoma of the sigmoid colon.  2. Status post exploratory laparotomy with sigmoid colectomy.  3. History of heavy alcohol use.   DISCHARGE MEDICATIONS:  The patient does not take any home medications.  However, he was given a prescription for Vicodin 5/325 one to two  tablets q.4 h. p.r.n. pain.   DISCHARGE INSTRUCTIONS:  The patient was told that when he returns home,  he has no dietary restrictions.  He was informed to increase his  activity slowly as well as he may walk up steps.  He is also able to  shower and is not to lift anything greater than 15 pounds for the next 4  weeks.  He is also informed that he is not to drive for the next week.  He is also informed that he needs to call [facility] if he  begins to develop fevers greater than 101.5, new or severe abdominal  pain, or redness or pus-like drainage from any of his incisions.  Otherwise at this time, he is told that he need to follow up the [facility] on March 06, 2008 at 2:45 p.m. for  staple removal.  At this time, he will be informed that he needs to  followup Dr. Luisa Hodge within the next 2 weeks for further followup.  He  was also informed that he needs to follow up with Eddie Hodge at the  [facility] at [place] on March 08, 2008, with an  appointment scheduled at 10 a.m., however, he is informed to be there at  9:30 for  checking.      Eddie Cape, PA      [place]. Derrell Hodge, M.D.  Electronically Signed    KEO/MEDQ  D:  03/01/2008  T:  03/01/2008  Job:  [id number]   cc:   Eddie Hodge, M.D.  Lauretta I. Odogwu, M.D.
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
## 2011-02-10 NOTE — Assessment & Plan Note (Signed)
Mr. Eddie Hodge is back regarding his bilateral subdural hemorrhages.  He was  discharged home on March 15, 2009 with his family.  He is receiving  outpatient therapy and is doing quite well.  Sounds as if he is only  working with speech and perhaps OT at this point for his cognitive  deficits.  He is walking without device.  He report some problems  sleeping, but states that it is due to his mattress.  Denies headaches  or seizures.  He is not drinking any longer.  His moods been good.  He  denies depression.  His daughters is living with him currently.   REVIEW OF SYSTEMS:  Notable for the intermittent confusion as well as  some limb swelling at times.  Otherwise, pertinent positives are above  and full review is in the written health and history section of the  chart.   SOCIAL HISTORY:  Notable for the above.  The patient was living by  himself prior to arrival.   MEDICATIONS:  Keppra 250 mg 2 times a day.   PHYSICAL EXAMINATION:  VITAL SIGNS:  Blood pressure is 103/60, pulse 61,  and respiratory rate 18.  He is sating 98% on room air.  GENERAL:  The patient is pleasant, alert, with cues to the date.  He was  alert to place and region.  He was able to do some simple math on his  own, however with some algebraic equations, he was a bit impaired.  He  remember 2/3 words after 5 minutes.  He has fair insight awareness.  Stable to follow multi-step commands.  Mood was pleasant.  Speech was  intact.  HEART:  Regular.  CHEST:  Clear.  ABDOMEN:  Soft, nontender.  EXTREMITIES:  The patient ambulated before me with normal stride width.  He is able to walk heel-to-toe without any difficulties today.  Strength  is 5/5 in all 4 extremities.  NEUROLOGIC:  Sensory exam is grossly intact.  Cranial nerve exam is  normal.  SKIN:  Intact.   ASSESSMENT:  1. Bilateral subdural hemorrhage.  2. History of colon cancer.   PLAN:  1. The patient has done extremely well to this point.  Continue with  outpatient therapies to completion.  The patient  still presents      with some mild cognitive deficits, which should improve somewhat.      I question whether there was some baseline cognitive deficits due      to his alcohol abuse.  2. Impressed the importance of alcohol abstinence to the patient      today.  3. Wean Keppra over the next month.  Decreased to 250 mg nightly x1      month and stop.  4. I will see the patient back in about 3 months' time for followup.      He is unable to live alone, but alone still at this point nor drive      due to his higher level cognitive deficits.      Ranelle Oyster, M.D.  Electronically Signed     ZTS/MedQ  D:  04/19/2009 11:11:45  T:  04/20/2009 00:45:19  Job #:  [id number]   cc:   Hewitt Shorts, M.D.  Fax: [phone]

## 2011-02-10 NOTE — Op Note (Signed)
NAME:  Eddie Hodge, Eddie Hodge                 ACCOUNT NO.:  [id number]   MEDICAL RECORD NO.:  [id number]          PATIENT TYPE:  INP   LOCATION:  5153                         FACILITY:  [facility]   PHYSICIAN:  Clovis Pu. Cornett, M.D.DATE OF BIRTH:  04/26/1941   DATE OF PROCEDURE:  02/24/2008  DATE OF DISCHARGE:                               OPERATIVE REPORT   PREOPERATIVE DIAGNOSIS:  Descending colon/sigmoid colon cancer with near  obstruction (stage II adenocarcinoma descending colon).   POSTOPERATIVE DIAGNOSIS:  Descending colon/sigmoid colon cancer with  near obstruction.   PROCEDURE:  Partial colectomy with resection of distal transverse colon,  splenic flexure, descending colon, proximal sigmoid colon, primary  anastomosis.   SURGEON:  Maisie Fus A. Cornett, MD   ANESTHESIA:  General endotracheal anesthesia.   ESTIMATED BLOOD LOSS:  150 mL.   SPECIMEN:  Transverse colon, splenic flexure, descending colon, and  proximal sigmoid colon to pathology for evaluation with stricture noted,  margins grossly negative by gross examination.   DRAINS:  None.   INDICATIONS FOR PROCEDURE:  The patient is a 70 year old male who was  admitted by Dr. Jeani Hawking due to a colon stricture after colonoscopy.  Biopsy proven adenocarcinoma.  This was in his distal descending colon  and proximal sigmoid colon.  He presents for operative resection of this  today.  Informed consent was obtained.  Procedure was discussed with the  patient.  All options were discussed with potential complications,  bleeding, infection and anastomotic leak by obstruction, DVT, myocardial  infarction, stroke, and blood clots.  He understood the above and agreed  to proceed.   DESCRIPTION OF PROCEDURE:  The patient was brought to the operating room  and placed supine.  He was placed initially in lithotomy.  After  induction of general anesthesia, the abdomen was prepped and draped  sterile fashion and a Foley catheter was placed.   He received  antibiotics.  Midline incision was used.  We started just above the  umbilicus and continued down to his pubic symphysis.  Upon entering the  abdominal cavity, I was able to palpate the lesion.  It was in the  distal descending colon and proximal sigmoid colon and was about 7 cm in  length.  Colon was quite redundant and floppy.  He had significant  amount of sigmoid colon below this.  I went ahead and mobilized the  colon along with white line of Toldt after placing self-retaining  retractor.  Once I mobilized this, I then selected a point in the  proximal sigmoid colon above where the IMA fit into the sigmoid colon.  A GIA 75 stapling device was used throughout the colon there.  LigaSure  was used to take the mesentery.  Preserving the IMA supply to the lower  segment of the sigmoid since this was going to be used for the  anastomosis.  The mass was primarily mostly in the descending colon  distal portion.  Once I got to the proximal resection margin, I resected  this with a GIA 75 stapling device.  After doing this, I began to  mobilize  the remaining descending colon up to the splenic flexure.  While I was palpating the transverse colon distal to the middle colic  vessel in mass, the mesentery was noted.  This was at the base of the  colon.  I felt this needed to be resected as well with it.  I went ahead  and chose a point in the transverse colon just proximal to it by about 3  cm.  I decided to go ahead and divide and take the remainder of this  colon since this mass was introduced well.  The polypectomy was done  elsewhere but these were benign.  Any event I felt this needed to be  resected and include this in my resection specimen.  An additional  loader was utilized, 75 stapling device was used just distal to the  middle colic vessel and I divided the transverse colon there.  Mesentery  was taken down away including the splenic flexure and the remainder of  the  transverse, splenic flexure, and descending colon were resected and  sent as specimen separately.  The mass was located at the base of the  colon.  After doing this, we fashioned a side-to-side stapled  anastomosis of the transverse colon to the sigmoid colon.  This was done  using a GIA 75 stapling device.  The enterotomy was then closed with a  TA-60.  Stitches were placed across the anastomosis.  The mesenteric  defect was quite large and I elected not to try to close this since it  would be very difficult and I fear this would break and lead to a bowel  obstruction.  He had plenty of room between the bands of mesentery and I  left this widely opened.  The anastomosis was under no tension and was  widely patent, had excellent bleeding from all edges and had no tension  whatsoever to it.  Irrigation was used and suctioned out.  I inspected  the left upper quadrant where I mobilized the colon away from the spleen  and saw no signs of bleeding.  The mesentery was widely hemostatic.  At  this point in time, all packs that we used to retract the bowel were  removed and counted and found to be correct.  I palpated the abdominal  cavity and felt no additional packs.  At this point in time, the  retractor was removed.  NG tube was positioned in the stomach  appropriately.  Liver was palpated and I felt no masses in the liver.  Gallbladder felt normal.  The remainder of the abdominal examination  including the small bowel appeared normal.  At this point in time, we  then closed the fascia with a running #1 PDS suture.  Staple was used to  close the skin.  Dry dressings were applied.  All final counts, sponge  and instruments were found to be correct at this portion of the case.  EBL was 150 mL.  The patient was awoke, taken to recovery room in  satisfactory condition.      Thomas A. Cornett, M.D.  Electronically Signed     TAC/MEDQ  D:  02/24/2008  T:  02/25/2008  Job:  [id number]   cc:    Jordan Hawks. Elnoria Howard, MD
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
## 2011-06-24 LAB — CBC
HCT: 36.2 — ABNORMAL LOW
HCT: 40.4
MCHC: 33.5
MCV: 97.6
MCV: 98.5
Platelets: 122 — ABNORMAL LOW
Platelets: 123 — ABNORMAL LOW
RBC: 3.68 — ABNORMAL LOW
RBC: 4.11 — ABNORMAL LOW
RDW: 13.2
WBC: 6.4
WBC: 7.2

## 2011-06-24 LAB — BASIC METABOLIC PANEL
BUN: 1 — ABNORMAL LOW
BUN: 9
CO2: 21
CO2: 22
Chloride: 111
Chloride: 112
Creatinine, Ser: 0.97
Creatinine, Ser: 1.24
GFR calc Af Amer: >60
GFR calc Af Amer: >60
GFR calc non Af Amer: 58 — ABNORMAL LOW
Glucose, Bld: 121 — ABNORMAL HIGH
Potassium: 3.7
Potassium: 3.8

## 2011-06-24 LAB — CREATININE, SERUM
Creatinine, Ser: 1.08
GFR calc non Af Amer: >60

## 2011-06-25 LAB — BASIC METABOLIC PANEL
CO2: 22
CO2: 22
Calcium: 8.5
Calcium: 8.6
Creatinine, Ser: 1.01
GFR calc Af Amer: >60
GFR calc Af Amer: >60
GFR calc non Af Amer: >60
Sodium: 145

## 2011-06-25 LAB — CBC
MCHC: 34.7
RBC: 3.58 — ABNORMAL LOW

## 2020-08-21 ENCOUNTER — Encounter (HOSPITAL_COMMUNITY): Payer: Self-pay

## 2020-08-21 ENCOUNTER — Emergency Department (HOSPITAL_COMMUNITY): Payer: Medicare Other

## 2020-08-21 ENCOUNTER — Encounter (HOSPITAL_COMMUNITY)
Admission: EM | Disposition: A | Discharge status: Home Health | Payer: Self-pay | Source: Home / Self Care | Attending: Vascular Surgery

## 2020-08-21 ENCOUNTER — Other Ambulatory Visit: Payer: Self-pay

## 2020-08-21 ENCOUNTER — Inpatient Hospital Stay (HOSPITAL_COMMUNITY)
Admission: EM | Admit: 2020-08-21 | Discharge: 2020-09-02 | DRG: 271 | Disposition: A | Discharge status: Home Health | Payer: Medicare Other | Attending: Vascular Surgery | Admitting: Vascular Surgery

## 2020-08-21 ENCOUNTER — Emergency Department (HOSPITAL_COMMUNITY): Payer: Medicare Other | Admitting: Registered Nurse

## 2020-08-21 DIAGNOSIS — F17200 Nicotine dependence, unspecified, uncomplicated: Secondary | ICD-10-CM | POA: Diagnosis not present

## 2020-08-21 DIAGNOSIS — Z20822 Contact with and (suspected) exposure to covid-19: Secondary | ICD-10-CM | POA: Diagnosis present

## 2020-08-21 DIAGNOSIS — N179 Acute kidney failure, unspecified: Secondary | ICD-10-CM | POA: Diagnosis not present

## 2020-08-21 DIAGNOSIS — I745 Embolism and thrombosis of iliac artery: Secondary | ICD-10-CM | POA: Diagnosis not present

## 2020-08-21 DIAGNOSIS — R509 Fever, unspecified: Secondary | ICD-10-CM | POA: Diagnosis not present

## 2020-08-21 DIAGNOSIS — Z87820 Personal history of traumatic brain injury: Secondary | ICD-10-CM | POA: Diagnosis not present

## 2020-08-21 DIAGNOSIS — Z23 Encounter for immunization: Secondary | ICD-10-CM

## 2020-08-21 DIAGNOSIS — D72829 Elevated white blood cell count, unspecified: Secondary | ICD-10-CM | POA: Diagnosis not present

## 2020-08-21 DIAGNOSIS — I7409 Other arterial embolism and thrombosis of abdominal aorta: Secondary | ICD-10-CM | POA: Diagnosis present

## 2020-08-21 DIAGNOSIS — F1010 Alcohol abuse, uncomplicated: Secondary | ICD-10-CM | POA: Diagnosis not present

## 2020-08-21 DIAGNOSIS — G8929 Other chronic pain: Secondary | ICD-10-CM | POA: Diagnosis present

## 2020-08-21 DIAGNOSIS — R54 Age-related physical debility: Secondary | ICD-10-CM | POA: Diagnosis not present

## 2020-08-21 DIAGNOSIS — M549 Dorsalgia, unspecified: Secondary | ICD-10-CM | POA: Diagnosis present

## 2020-08-21 DIAGNOSIS — Z85038 Personal history of other malignant neoplasm of large intestine: Secondary | ICD-10-CM

## 2020-08-21 DIAGNOSIS — I70223 Atherosclerosis of native arteries of extremities with rest pain, bilateral legs: Secondary | ICD-10-CM | POA: Diagnosis not present

## 2020-08-21 DIAGNOSIS — R569 Unspecified convulsions: Secondary | ICD-10-CM | POA: Diagnosis present

## 2020-08-21 DIAGNOSIS — I70201 Unspecified atherosclerosis of native arteries of extremities, right leg: Secondary | ICD-10-CM

## 2020-08-21 DIAGNOSIS — I998 Other disorder of circulatory system: Secondary | ICD-10-CM

## 2020-08-21 HISTORY — PX: THROMBECTOMY FEMORAL ARTERY: SHX6406

## 2020-08-21 HISTORY — PX: FASCIOTOMY: SHX132

## 2020-08-21 LAB — COMPREHENSIVE METABOLIC PANEL
ALT: 15 U/L (ref 0–44)
AST: 28 U/L (ref 15–41)
Albumin: 3.7 g/dL (ref 3.5–5.0)
Alkaline Phosphatase: 91 U/L (ref 38–126)
Anion gap: 13 (ref 5–15)
BUN: 16 mg/dL (ref 8–23)
CO2: 24 mmol/L (ref 22–32)
Calcium: 9.9 mg/dL (ref 8.9–10.3)
Chloride: 102 mmol/L (ref 98–111)
Creatinine, Ser: 1.39 mg/dL — ABNORMAL HIGH (ref 0.61–1.24)
GFR, Estimated: 51 mL/min — ABNORMAL LOW (ref >60)
Glucose, Bld: 107 mg/dL — ABNORMAL HIGH (ref 70–99)
Potassium: 3.6 mmol/L (ref 3.5–5.1)
Sodium: 139 mmol/L (ref 135–145)
Total Bilirubin: 1.8 mg/dL — ABNORMAL HIGH (ref 0.3–1.2)
Total Protein: 7.4 g/dL (ref 6.5–8.1)

## 2020-08-21 LAB — CBC
HCT: 58 % — ABNORMAL HIGH (ref 39.0–52.0)
Hemoglobin: 18.8 g/dL — ABNORMAL HIGH (ref 13.0–17.0)
MCH: 33 pg (ref 26.0–34.0)
MCHC: 32.4 g/dL (ref 30.0–36.0)
MCV: 101.9 fL — ABNORMAL HIGH (ref 80.0–100.0)
Platelets: 121 10*3/uL — ABNORMAL LOW (ref 150–400)
RBC: 5.69 MIL/uL (ref 4.22–5.81)
RDW: 13.2 % (ref 11.5–15.5)
WBC: 8.2 10*3/uL (ref 4.0–10.5)
nRBC: 0 % (ref 0.0–0.2)

## 2020-08-21 LAB — I-STAT CHEM 8, ED
BUN: 19 mg/dL (ref 8–23)
Calcium, Ion: 1.18 mmol/L (ref 1.15–1.40)
Chloride: 102 mmol/L (ref 98–111)
Creatinine, Ser: 1.3 mg/dL — ABNORMAL HIGH (ref 0.61–1.24)
Glucose, Bld: 106 mg/dL — ABNORMAL HIGH (ref 70–99)
HCT: 62 % — ABNORMAL HIGH (ref 39.0–52.0)
Hemoglobin: 21.1 g/dL (ref 13.0–17.0)
Potassium: 3.7 mmol/L (ref 3.5–5.1)
Sodium: 139 mmol/L (ref 135–145)
TCO2: 24 mmol/L (ref 22–32)

## 2020-08-21 LAB — RAPID URINE DRUG SCREEN, HOSP PERFORMED
Amphetamines: NOT DETECTED
Barbiturates: NOT DETECTED
Benzodiazepines: NOT DETECTED
Cocaine: NOT DETECTED
Opiates: NOT DETECTED
Tetrahydrocannabinol: NOT DETECTED

## 2020-08-21 LAB — URINALYSIS, ROUTINE W REFLEX MICROSCOPIC
Bacteria, UA: NONE SEEN
Bilirubin Urine: NEGATIVE
Glucose, UA: NEGATIVE mg/dL
Ketones, ur: 5 mg/dL — AB
Leukocytes,Ua: NEGATIVE
Nitrite: NEGATIVE
Protein, ur: 100 mg/dL — AB
Specific Gravity, Urine: 1.023 (ref 1.005–1.030)
pH: 5 (ref 5.0–8.0)

## 2020-08-21 LAB — ETHANOL: Alcohol, Ethyl (B): <10 mg/dL (ref <10)

## 2020-08-21 LAB — DIFFERENTIAL
Abs Immature Granulocytes: 0.05 10*3/uL (ref 0.00–0.07)
Basophils Absolute: 0 10*3/uL (ref 0.0–0.1)
Basophils Relative: 0 %
Eosinophils Absolute: 0 10*3/uL (ref 0.0–0.5)
Eosinophils Relative: 0 %
Immature Granulocytes: 1 %
Lymphocytes Relative: 10 %
Lymphs Abs: 0.9 10*3/uL (ref 0.7–4.0)
Monocytes Absolute: 0.8 10*3/uL (ref 0.1–1.0)
Monocytes Relative: 10 %
Neutro Abs: 6.4 10*3/uL (ref 1.7–7.7)
Neutrophils Relative %: 79 %

## 2020-08-21 LAB — RESP PANEL BY RT-PCR (FLU A&B, COVID) ARPGX2
Influenza A by PCR: NEGATIVE
Influenza B by PCR: NEGATIVE
SARS Coronavirus 2 by RT PCR: NEGATIVE

## 2020-08-21 LAB — APTT: aPTT: 30 seconds (ref 24–36)

## 2020-08-21 LAB — PROTIME-INR
INR: 1.1 (ref 0.8–1.2)
Prothrombin Time: 13.4 seconds (ref 11.4–15.2)

## 2020-08-21 LAB — CBG MONITORING, ED: Glucose-Capillary: 93 mg/dL (ref 70–99)

## 2020-08-21 LAB — CK: Total CK: 35 U/L — ABNORMAL LOW (ref 49–397)

## 2020-08-21 SURGERY — THROMBECTOMY, ARTERY, FEMORAL
Anesthesia: General | Site: Leg Lower | Laterality: Bilateral

## 2020-08-21 MED ORDER — IOHEXOL 350 MG/ML SOLN
100.0000 mL | Freq: Once | INTRAVENOUS | Status: AC | PRN
  Administered 2020-08-21: 100 mL via INTRAVENOUS

## 2020-08-21 MED ORDER — LACTATED RINGERS IV SOLN: INTRAVENOUS | Status: DC | PRN

## 2020-08-21 MED ORDER — SODIUM CHLORIDE 0.9 % IV SOLN
INTRAVENOUS | Status: DC | PRN
  Administered 2020-08-21: 23:00:00 500 mL

## 2020-08-21 MED ORDER — PROPOFOL 10 MG/ML IV BOLUS
INTRAVENOUS | Status: DC | PRN
  Administered 2020-08-21: 25 mg via INTRAVENOUS
  Administered 2020-08-21: 100 mg via INTRAVENOUS
  Administered 2020-08-22: 25 mg via INTRAVENOUS

## 2020-08-21 MED ORDER — FENTANYL CITRATE (PF) 100 MCG/2ML IJ SOLN
50.0000 ug | Freq: Once | INTRAMUSCULAR | Status: AC
  Administered 2020-08-21: 50 ug via INTRAVENOUS
  Filled 2020-08-21: qty 2

## 2020-08-21 MED ORDER — HEPARIN (PORCINE) 25000 UT/250ML-% IV SOLN
1250.0000 [IU]/h | INTRAVENOUS | Status: DC
  Administered 2020-08-21 – 2020-08-22 (×2): 1300 [IU]/h via INTRAVENOUS
  Administered 2020-08-23: 1250 [IU]/h via INTRAVENOUS
  Filled 2020-08-21 (×4): qty 250

## 2020-08-21 MED ORDER — FENTANYL CITRATE (PF) 250 MCG/5ML IJ SOLN
INTRAMUSCULAR | Status: DC | PRN
  Administered 2020-08-21: 100 ug via INTRAVENOUS
  Administered 2020-08-21 – 2020-08-22 (×3): 50 ug via INTRAVENOUS

## 2020-08-21 MED ORDER — ROCURONIUM BROMIDE 10 MG/ML (PF) SYRINGE
PREFILLED_SYRINGE | INTRAVENOUS | Status: DC | PRN
  Administered 2020-08-21 (×2): 20 mg via INTRAVENOUS
  Administered 2020-08-21: 50 mg via INTRAVENOUS

## 2020-08-21 MED ORDER — HEPARIN SODIUM (PORCINE) 1000 UNIT/ML IJ SOLN
INTRAMUSCULAR | Status: DC | PRN
  Administered 2020-08-21: 8000 [IU] via INTRAVENOUS

## 2020-08-21 MED ORDER — SODIUM CHLORIDE 0.9 % IV BOLUS
500.0000 mL | Freq: Once | INTRAVENOUS | Status: AC
  Administered 2020-08-21: 500 mL via INTRAVENOUS

## 2020-08-21 MED ORDER — 0.9 % SODIUM CHLORIDE (POUR BTL) OPTIME
TOPICAL | Status: DC | PRN
  Administered 2020-08-21: 1000 mL

## 2020-08-21 MED ORDER — LIDOCAINE 2% (20 MG/ML) 5 ML SYRINGE
INTRAMUSCULAR | Status: DC | PRN
  Administered 2020-08-21: 100 mg via INTRAVENOUS

## 2020-08-21 MED ORDER — HEPARIN BOLUS VIA INFUSION
2400.0000 [IU] | Freq: Once | INTRAVENOUS | Status: AC
  Administered 2020-08-21: 2400 [IU] via INTRAVENOUS
  Filled 2020-08-21: qty 2400

## 2020-08-21 MED ORDER — SUCCINYLCHOLINE CHLORIDE 200 MG/10ML IV SOSY
PREFILLED_SYRINGE | INTRAVENOUS | Status: DC | PRN
  Administered 2020-08-21: 140 mg via INTRAVENOUS

## 2020-08-21 MED ORDER — CEFAZOLIN SODIUM-DEXTROSE 2-3 GM-%(50ML) IV SOLR
INTRAVENOUS | Status: DC | PRN
  Administered 2020-08-21: 2 g via INTRAVENOUS

## 2020-08-21 SURGICAL SUPPLY — 78 items
ADH SKN CLS APL DERMABOND .7 (GAUZE/BANDAGES/DRESSINGS) ×2
AGENT HMST SPONGE THK3/8 (HEMOSTASIS)
BANDAGE ESMARK 6X9 LF (GAUZE/BANDAGES/DRESSINGS) IMPLANT
BNDG CMPR 9X6 STRL LF SNTH (GAUZE/BANDAGES/DRESSINGS)
BNDG ELASTIC 4X5.8 VLCR STR LF (GAUZE/BANDAGES/DRESSINGS) ×6 IMPLANT
BNDG ELASTIC 6X5.8 VLCR STR LF (GAUZE/BANDAGES/DRESSINGS) IMPLANT
BNDG ESMARK 6X9 LF (GAUZE/BANDAGES/DRESSINGS)
BNDG GAUZE ELAST 4 BULKY (GAUZE/BANDAGES/DRESSINGS) IMPLANT
CANISTER SUCT 3000ML PPV (MISCELLANEOUS) ×3 IMPLANT
CATH EMB 3FR 80CM (CATHETERS) ×3 IMPLANT
CATH EMB 4FR 80CM (CATHETERS) ×3 IMPLANT
CATH FOLEY 2WAY SLVR  5CC 14FR (CATHETERS) ×3
CATH FOLEY 2WAY SLVR 5CC 14FR (CATHETERS) ×1 IMPLANT
CATH OMNI FLUSH 5F 65CM (CATHETERS) ×3 IMPLANT
CLIP VESOCCLUDE MED 24/CT (CLIP) ×3 IMPLANT
CLIP VESOCCLUDE SM WIDE 24/CT (CLIP) ×3 IMPLANT
CNTNR URN SCR LID CUP LEK RST (MISCELLANEOUS) ×1 IMPLANT
CONT SPEC 4OZ STRL OR WHT (MISCELLANEOUS) ×3
COVER PROBE W GEL 5X96 (DRAPES) ×3 IMPLANT
COVER WAND RF STERILE (DRAPES) ×3 IMPLANT
CUFF TOURN SGL QUICK 24 (TOURNIQUET CUFF)
CUFF TOURN SGL QUICK 34 (TOURNIQUET CUFF)
CUFF TOURN SGL QUICK 42 (TOURNIQUET CUFF) IMPLANT
CUFF TRNQT CYL 24X4X16.5-23 (TOURNIQUET CUFF) IMPLANT
CUFF TRNQT CYL 34X4.125X (TOURNIQUET CUFF) IMPLANT
DERMABOND ADVANCED (GAUZE/BANDAGES/DRESSINGS) ×4
DERMABOND ADVANCED .7 DNX12 (GAUZE/BANDAGES/DRESSINGS) ×2 IMPLANT
DRAIN CHANNEL 15F RND FF W/TCR (WOUND CARE) IMPLANT
DRAPE C-ARM 42X72 X-RAY (DRAPES) ×3 IMPLANT
DRAPE INCISE IOBAN 66X45 STRL (DRAPES) IMPLANT
DRSG COVADERM 4X10 (GAUZE/BANDAGES/DRESSINGS) ×6 IMPLANT
DRSG COVADERM 4X8 (GAUZE/BANDAGES/DRESSINGS) ×6 IMPLANT
ELECT REM PT RETURN 9FT ADLT (ELECTROSURGICAL) ×3
ELECTRODE REM PT RTRN 9FT ADLT (ELECTROSURGICAL) ×1 IMPLANT
EVACUATOR SILICONE 100CC (DRAIN) IMPLANT
GAUZE SPONGE 4X4 12PLY STRL (GAUZE/BANDAGES/DRESSINGS) ×3 IMPLANT
GLOVE BIO SURGEON STRL SZ7.5 (GLOVE) ×3 IMPLANT
GLOVE BIOGEL PI IND STRL 8 (GLOVE) ×1 IMPLANT
GLOVE BIOGEL PI INDICATOR 8 (GLOVE) ×2
GOWN STRL REUS W/ TWL LRG LVL3 (GOWN DISPOSABLE) ×2 IMPLANT
GOWN STRL REUS W/ TWL XL LVL3 (GOWN DISPOSABLE) ×2 IMPLANT
GOWN STRL REUS W/TWL LRG LVL3 (GOWN DISPOSABLE) ×6
GOWN STRL REUS W/TWL XL LVL3 (GOWN DISPOSABLE) ×6
HEMOSTAT SPONGE AVITENE ULTRA (HEMOSTASIS) IMPLANT
INSERT FOGARTY SM (MISCELLANEOUS) IMPLANT
KIT BASIN OR (CUSTOM PROCEDURE TRAY) ×3 IMPLANT
KIT TURNOVER KIT B (KITS) ×3 IMPLANT
LOOP VESSEL MINI RED (MISCELLANEOUS) ×3 IMPLANT
NS IRRIG 1000ML POUR BTL (IV SOLUTION) ×6 IMPLANT
PACK GENERAL/GYN (CUSTOM PROCEDURE TRAY) ×3 IMPLANT
PACK PERIPHERAL VASCULAR (CUSTOM PROCEDURE TRAY) ×3 IMPLANT
PACK UNIVERSAL I (CUSTOM PROCEDURE TRAY) ×3 IMPLANT
PAD ARMBOARD 7.5X6 YLW CONV (MISCELLANEOUS) ×6 IMPLANT
SET MICROPUNCTURE 5F STIFF (MISCELLANEOUS) ×3 IMPLANT
STAPLER VISISTAT 35W (STAPLE) IMPLANT
STOPCOCK 4 WAY LG BORE MALE ST (IV SETS) IMPLANT
SUT ETHILON 3 0 PS 1 (SUTURE) IMPLANT
SUT GORETEX 5 0 TT13 24 (SUTURE) IMPLANT
SUT GORETEX 6.0 TT13 (SUTURE) IMPLANT
SUT MNCRL AB 4-0 PS2 18 (SUTURE) ×9 IMPLANT
SUT PROLENE 5 0 C 1 24 (SUTURE) ×3 IMPLANT
SUT PROLENE 6 0 BV (SUTURE) ×36 IMPLANT
SUT PROLENE 7 0 BV 1 (SUTURE) IMPLANT
SUT SILK 2 0 PERMA HAND 18 BK (SUTURE) IMPLANT
SUT SILK 3 0 (SUTURE)
SUT SILK 3-0 18XBRD TIE 12 (SUTURE) IMPLANT
SUT VIC AB 2-0 CT1 27 (SUTURE) ×6
SUT VIC AB 2-0 CT1 TAPERPNT 27 (SUTURE) ×2 IMPLANT
SUT VIC AB 2-0 CTX 36 (SUTURE) IMPLANT
SUT VIC AB 3-0 SH 27 (SUTURE) ×9
SUT VIC AB 3-0 SH 27X BRD (SUTURE) ×3 IMPLANT
SYR BULB IRRIG 60ML STRL (SYRINGE) ×3 IMPLANT
TOWEL GREEN STERILE (TOWEL DISPOSABLE) ×3 IMPLANT
TRAY FOLEY MTR SLVR 16FR STAT (SET/KITS/TRAYS/PACK) ×3 IMPLANT
TUBING EXTENTION W/L.L. (IV SETS) IMPLANT
UNDERPAD 30X36 HEAVY ABSORB (UNDERPADS AND DIAPERS) ×3 IMPLANT
WATER STERILE IRR 1000ML POUR (IV SOLUTION) ×3 IMPLANT
WIRE BENTSON .035X145CM (WIRE) ×3 IMPLANT

## 2020-08-21 NOTE — ED Triage Notes (Signed)
Pt BIB EMS from [facility] hospital for occlusion of iliac artery, affecting bilateral lower extremities. Received 2400 bolus and 1300 drip Heparin.   Negative for COVID (resulted at [facility] before transfer) 20G IV L AC

## 2020-08-21 NOTE — Consult Note (Addendum)
Hospital Consult    Reason for Consult:  Ischemic bilateral lower extremities Referring Physician:  Elvina Sidle ED MRN #:  [id number]  History of Present Illness: This is a 79 y.o. male with history of tobacco abuse that presents as a transfer from [facility] with acutely ischemic bilateral lower extremities.  Patient states he was in his normal state of health until 3 PM today when he had sudden onset severe pain in both thighs.  He states this started spreading down both legs and he ultimately developed numbness and weakness.  This has since improved in the left leg, but he is having ongoing issues with the right leg.  He had a CTA at [facility] and has been started on heparin.  CTA suggested likely thrombotic/embolic event to right common femoral, right popliteal, and left common and external iliac artery.  He denies any history of previous thromboembolic events.  No lower extremity surgeries.  Chart indicates history of colon cancer although he denies this.  He does have a evidence of a previous small midline laparotomy incision and he is unclear about this surgery.  History reviewed. No pertinent past medical history.  PSH: Denies, although has small midline laparotomy scar  No Known Allergies  Prior to Admission medications   Medication Sig Start Date End Date Taking? Authorizing Provider  Omega-3 Fatty Acids (FISH OIL) 1000 MG CAPS Take 1,000 mg by mouth daily.   Yes [provider]    Social History   Socioeconomic History  . Marital status: Single    Spouse name: Not on file  . Number of children: Not on file  . Years of education: Not on file  . Highest education level: Not on file  Occupational History  . Not on file  Tobacco Use  . Smoking status: Not on file  Substance and Sexual Activity  . Alcohol use: Not on file  . Drug use: Not on file  . Sexual activity: Not on file  Other Topics Concern  . Not on file  Social History Narrative  . Not on file    Social Determinants of Health   Financial Resource Strain:   . Difficulty of Paying Living Expenses: Not on file  Food Insecurity:   . Worried About Charity fundraiser in the Last Year: Not on file  . Ran Out of Food in the Last Year: Not on file  Transportation Needs:   . Lack of Transportation (Medical): Not on file  . Lack of Transportation (Non-Medical): Not on file  Physical Activity:   . Days of Exercise per Week: Not on file  . Minutes of Exercise per Session: Not on file  Stress:   . Feeling of Stress : Not on file  Social Connections:   . Frequency of Communication with Friends and Family: Not on file  . Frequency of Social Gatherings with Friends and Family: Not on file  . Attends Religious Services: Not on file  . Active Member of Clubs or Organizations: Not on file  . Attends Archivist Meetings: Not on file  . Marital Status: Not on file  Intimate Partner Violence:   . Fear of Current or Ex-Partner: Not on file  . Emotionally Abused: Not on file  . Physically Abused: Not on file  . Sexually Abused: Not on file     No family history on file.  ROS: [x]  Positive   [ ]  Negative   [ ]  All sytems reviewed and are negative  Cardiovascular: []  chest pain/pressure []  palpitations []  SOB lying flat []  DOE []  pain in legs while walking [x]  pain in legs at rest [x]  pain in legs at night []  non-healing ulcers []  hx of DVT []  swelling in legs  Pulmonary: []  productive cough []  asthma/wheezing []  home O2  Neurologic: []  weakness in []  arms []  legs []  numbness in []  arms []  legs []  hx of CVA []  mini stroke [] difficulty speaking or slurred speech []  temporary loss of vision in one eye []  dizziness  Hematologic: []  hx of cancer []  bleeding problems []  problems with blood clotting easily  Endocrine:   []  diabetes []  thyroid disease  GI []  vomiting blood []  blood in stool  GU: []  CKD/renal failure []  HD--[]  M/W/F or []  T/T/S []  burning  with urination []  blood in urine  Psychiatric: []  anxiety []  depression  Musculoskeletal: []  arthritis []  joint pain  Integumentary: []  rashes []  ulcers  Constitutional: []  fever []  chills   Physical Examination  Vitals:   08/21/20 2114 08/21/20 2117  BP:  (!) 148/86  Pulse: 92 84  Resp: 18 18  Temp: 98.2 F (36.8 C) 98.2 F (36.8 C)  SpO2:  96%   Body mass index is 22.24 kg/m.  General:  NAD Gait: Not observed HENT: WNL, normocephalic Pulmonary: no respiratory distress Cardiac: regular, without  Murmurs, rubs or gallops Abdomen:  soft, NT/ND Skin: without rashes Vascular Exam/Pulses: Unable to appreciate femoral pulses bilaterally No doppler flow in either lower extremity Left foot appears motor and sensory intact Right foot decreased sensation and subjectively weak motor Extremities: with ischemic changes bilaterally, both feet are cool Musculoskeletal: no muscle wasting or atrophy  Neurologic: A&O X 3; Appropriate Affect  CBC    Component Value Date/Time   WBC 8.2 08/21/2020 1821   RBC 5.69 08/21/2020 1821   HGB 21.1 (HH) 08/21/2020 1836   HGB 12.9 (L) 03/08/2008 1012   HCT 62.0 (H) 08/21/2020 1836   HCT 39.4 03/08/2008 1012   PLT 121 (L) 08/21/2020 1821   PLT 222 03/08/2008 1012   MCV 101.9 (H) 08/21/2020 1821   MCV 97.7 03/08/2008 1012   MCH 33.0 08/21/2020 1821   MCHC 32.4 08/21/2020 1821   RDW 13.2 08/21/2020 1821   RDW 12.4 03/08/2008 1012   LYMPHSABS 0.9 08/21/2020 1821   LYMPHSABS 2.0 03/08/2008 1012   MONOABS 0.8 08/21/2020 1821   MONOABS 0.5 03/08/2008 1012   EOSABS 0.0 08/21/2020 1821   EOSABS 0.2 03/08/2008 1012   BASOSABS 0.0 08/21/2020 1821   BASOSABS 0.1 03/08/2008 1012    BMET    Component Value Date/Time   NA 139 08/21/2020 1836   K 3.7 08/21/2020 1836   CL 102 08/21/2020 1836   CO2 24 08/21/2020 1821   GLUCOSE 106 (H) 08/21/2020 1836   BUN 19 08/21/2020 1836   CREATININE 1.30 (H) 08/21/2020 1836   CALCIUM 9.9  08/21/2020 1821   GFRNONAA 51 (L) 08/21/2020 1821   GFRAA  03/14/2009 0625    >60        The eGFR has been calculated using the MDRD equation. This calculation has not been validated in all clinical situations. eGFR's persistently <60 mL/min signify possible Chronic Kidney Disease.    COAGS: Lab Results  Component Value Date   INR 1.1 08/21/2020   INR 0.9 02/12/2009   INR 1.1 12/10/2008     Non-Invasive Vascular Imaging:    CTA abdomen pelvis with runoff today shows mural thrombus and plaque throughout  the distal descending thoracic aorta and abdominal aorta.  There is thrombus in the right common femoral artery at the SFA profunda bifurcation.  There also appears to be thrombus in the right popliteal artery.  There is minimal reconstitution of a peroneal artery on the right.  On the left the common and external iliac artery appear occluded.  The left common femoral artery does reconstitute with SFA profunda runoff and no identifiable flow below left knee.   ASSESSMENT/PLAN: This is a 79 y.o. male with history of tobacco abuse that presents with suspected acute bilateral lower extremity ischemia with CTA evidence of right common femoral and right popliteal thrombus as well as left common and external iliac thrombus.  I recommend proceeding to the operating room urgently for bilateral lower extremity thrombectomy and possible lower extremity fasciotomies.  Discussed femoral and possible popliteal approach.  This appears to be acute as he states he was in normal state of health prior to 3 PM today and denies any prior history of claudication or lower leg pain while walking.  He has no runoff at the ankle on CTA and no Doppler flow in either foot.  Discussed high risk for limb loss even with intervention.  Will need further embolic work-up post-op but suspect could be from descending thoracic and abdominal aortic mural thrombus.  Marty Heck, MD [facility]  [place] Office: [place]

## 2020-08-21 NOTE — ED Triage Notes (Signed)
BIBA Per EMS: Pt coming from home with complaints of generalized weakness and bilateral leg pain x1-2 days. Decreased fluid intake; does drink liquor daily  A&O x4  Vitals:  Temp 99.8 88 HR 99% RA 184/92 20 RR CBG 164  18 L AC  500 ml fluids

## 2020-08-21 NOTE — Progress Notes (Signed)
ANTICOAGULATION CONSULT NOTE - Initial Consult  Pharmacy Consult for heparin Indication: embolic arterial occlusion of legs  No Known Allergies  Patient Measurements: Height: 6' (182.9 cm) Weight: 74.4 kg (164 lb) IBW/kg (Calculated) : 77.6 Heparin Dosing Weight: n/a. Use TBW = 74 kg  Vital Signs: Temp: 98.2 F (36.8 C) (11/24 1929) Temp Source: Oral (11/24 1929) BP: 163/77 (11/24 2000) Pulse Rate: 81 (11/24 2000)  Labs: Recent Labs    08/21/20 1821 08/21/20 1836  HGB 18.8* 21.1*  HCT 58.0* 62.0*  PLT 121*  --   APTT 30  --   LABPROT 13.4  --   INR 1.1  --   CREATININE 1.39* 1.30*  CKTOTAL 35*  --     Estimated Creatinine Clearance: 47.7 mL/min (A) (by C-G formula based on SCr of 1.3 mg/dL (H)).   Medical History: History reviewed. No pertinent past medical history.  Medications: Pt not taking any prescriptions medications PTA per med rec. No anticoagulants.   Assessment: Pharmacy consulted to dose/monitor heparin in this 30 yoM with arterial occlusion of leg.   Today, 08/21/20  Hgb 21.1, elevated  Plt 121, slightly low  INR 1.1 at baseline  SCr 1.3, CrCl 48 mL/min  Goal of Therapy:  Heparin level 0.3-0.7 units/ml Monitor platelets by anticoagulation protocol: Yes   Plan:  Give 2400 units bolus x 1 Start heparin infusion at 1300 units/hr Check anti-Xa level in 8 hours and daily while on heparin Continue to monitor H&H and platelets  Lenis Noon, PharmD 08/21/2020,8:49 PM

## 2020-08-21 NOTE — ED Provider Notes (Signed)
Eddie Hodge Provider Note   CSN: [id number] Arrival date & time: 08/21/20  1728     History Chief Complaint  Patient presents with   Weakness   Leg Pain    Bilateral    Eddie Hodge is a 79 y.o. male.  HPI 79 year old male presents with right leg pain.  Overall the patient is not the greatest historian and the son helps provide further history at the bedside.  The patient states that he was in the kitchen and when he went to go sit down, he felt acute pain in both thighs.  The left has resolved but he is still having pain in the right.  He also feels like his foot is numb.  He has chronic back pain but no new back pain.  The patient denies any headache or upper extremity symptoms.  Pain is mid to proximal thigh.  No injuries.  Son endorses that he has been having some leg pain for a while but it seems to be worse today.  Often has to stop because of weakness in the legs.  History reviewed. No pertinent past medical history.  Patient Active Problem List   Diagnosis Date Noted   EDEMA 04/04/2009   COLON CANCER 04/03/2009   ALCOHOL ABUSE 04/03/2009   ANISOCORIA 04/03/2009   BRADYCARDIA 04/03/2009   SUBDURAL HEMATOMA, CHRONIC 04/03/2009       No family history on file.  Social History   Tobacco Use   Smoking status: Not on file  Substance Use Topics   Alcohol use: Not on file   Drug use: Not on file    Home Medications Prior to Admission medications   Medication Sig Start Date End Date Taking? Authorizing Provider  Omega-3 Fatty Acids (FISH OIL) 1000 MG CAPS Take 1,000 mg by mouth daily.   Yes [provider]    Allergies    Patient has no known allergies.  Review of Systems   Review of Systems  Constitutional: Negative for fever.  Respiratory: Negative for shortness of breath.   Cardiovascular: Negative for chest pain and leg swelling.  Gastrointestinal: Negative for abdominal pain.  Musculoskeletal:  Positive for myalgias.  Neurological: Positive for weakness and numbness.  All other systems reviewed and are negative.   Physical Exam Updated Vital Signs BP (!) 154/85    Pulse 68    Temp 98.2 F (36.8 C) (Oral)    Resp 14    Ht 6' (1.829 m)    Wt 74.4 kg    SpO2 (!) 85%    BMI 22.24 kg/m   Physical Exam Vitals and nursing note reviewed.  Constitutional:      General: He is not in acute distress.    Appearance: He is well-developed. He is not ill-appearing or diaphoretic.  HENT:     Head: Normocephalic and atraumatic.     Right Ear: External ear normal.     Left Ear: External ear normal.     Nose: Nose normal.  Eyes:     General:        Right eye: No discharge.        Left eye: No discharge.  Cardiovascular:     Rate and Rhythm: Normal rate and regular rhythm.     Heart sounds: Normal heart sounds.  Pulmonary:     Effort: Pulmonary effort is normal.     Breath sounds: Normal breath sounds.  Abdominal:     Palpations: Abdomen is soft.  Tenderness: There is no abdominal tenderness.  Musculoskeletal:     Cervical back: Neck supple.     Comments: Both feet are a little cool to touch but are equal. No tenderness or swelling throughout lower extremities. Hard to get pulses in feet but this is bilateral.  Skin:    General: Skin is warm and dry.  Neurological:     Mental Status: He is alert.     Deep Tendon Reflexes: Babinski sign absent on the right side. Babinski sign absent on the left side.     Reflex Scores:      Patellar reflexes are 1+ on the right side and 2+ on the left side.      Achilles reflexes are 1+ on the right side and 2+ on the left side.    Comments: CN 3-12 grossly intact. 5/5 strength in bilateral upper extremities but the right leg is a little weak to resistance compared to left (which is 5/5). Hard to tell if it's pain related or weakness.. Grossly normal sensation except for decreased sensation and loss of sharp/dull differentiation from right knee  down in the right leg  Psychiatric:        Mood and Affect: Mood is not anxious.     ED Results / Procedures / Treatments   Labs (all labs ordered are listed, but only abnormal results are displayed) Labs Reviewed  CBC - Abnormal; Notable for the following components:      Result Value   Hemoglobin 18.8 (*)    HCT 58.0 (*)    MCV 101.9 (*)    Platelets 121 (*)    All other components within normal limits  COMPREHENSIVE METABOLIC PANEL - Abnormal; Notable for the following components:   Glucose, Bld 107 (*)    Creatinine, Ser 1.39 (*)    Total Bilirubin 1.8 (*)    GFR, Estimated 51 (*)    All other components within normal limits  URINALYSIS, ROUTINE W REFLEX MICROSCOPIC - Abnormal; Notable for the following components:   Hgb urine dipstick SMALL (*)    Ketones, ur 5 (*)    Protein, ur 100 (*)    All other components within normal limits  CK - Abnormal; Notable for the following components:   Total CK 35 (*)    All other components within normal limits  I-STAT CHEM 8, ED - Abnormal; Notable for the following components:   Creatinine, Ser 1.30 (*)    Glucose, Bld 106 (*)    Hemoglobin 21.1 (*)    HCT 62.0 (*)    All other components within normal limits  RESP PANEL BY RT-PCR (FLU A&B, COVID) ARPGX2  ETHANOL  PROTIME-INR  APTT  DIFFERENTIAL  RAPID URINE DRUG SCREEN, HOSP PERFORMED  CBC  HEPARIN LEVEL (UNFRACTIONATED)  CBG MONITORING, ED    EKG EKG Interpretation  Date/Time:  Wednesday August 21 2020 17:48:06 EST Ventricular Rate:  84 PR Interval:    QRS Duration: 85 QT Interval:  388 QTC Calculation: 459 R Axis:   63 Text Interpretation: Sinus rhythm LAE, consider biatrial enlargement similar to 2010 Confirmed by Sherwood Gambler [phone]) on 08/21/2020 7:16:09 PM   Radiology CT HEAD WO CONTRAST  Result Date: 08/21/2020 CLINICAL DATA:  79 year old male with neurologic deficit. Concern for stroke. EXAM: CT HEAD WITHOUT CONTRAST TECHNIQUE: Contiguous axial  images were obtained from the base of the skull through the vertex without intravenous contrast. COMPARISON:  Head CT dated 03/08/2009. FINDINGS: Brain: Mild age-related atrophy and chronic microvascular ischemic  changes. There is no acute intracranial hemorrhage. Small low attenuating bifrontal subdural densities may be postsurgical or residual from old hematoma. No mass effect or midline shift. No extra-axial fluid collection. Vascular: No hyperdense vessel or unexpected calcification. Skull: No acute calvarial pathology. Bifrontal craniotomies. Sinuses/Orbits: No acute finding. Other: None IMPRESSION: 1. No acute intracranial hemorrhage. 2. Mild age-related atrophy and chronic microvascular ischemic changes. Electronically Signed   By: Anner Crete M.D.   On: 08/21/2020 19:45   CT ANGIO AO+BIFEM W & OR WO CONTRAST  Result Date: 08/21/2020 CLINICAL DATA:  Claudication EXAM: CT ANGIOGRAPHY OF ABDOMINAL AORTA WITH ILIOFEMORAL RUNOFF TECHNIQUE: Multidetector CT imaging of the abdomen, pelvis and lower extremities was performed using the standard protocol during bolus administration of intravenous contrast. Multiplanar CT image reconstructions and MIPs were obtained to evaluate the vascular anatomy. CONTRAST:  121mL OMNIPAQUE IOHEXOL 350 MG/ML SOLN COMPARISON:  None. FINDINGS: VASCULAR Aorta: The distal descending thoracic aorta measures up to approximately 3 cm. There is irregular noncalcified eccentric plaque throughout the distal descending thoracic aorta and portions of the abdominal aorta. There is calcified atherosclerotic plaque in the mid to distal abdominal aorta. Celiac: The celiac axis is patent. SMA: Patent without evidence of aneurysm, dissection, vasculitis or significant stenosis. Renals: Both renal arteries are patent without evidence of aneurysm, dissection, vasculitis, fibromuscular dysplasia or significant stenosis. IMA: Patent without evidence of aneurysm, dissection, vasculitis or  significant stenosis. RIGHT Lower Extremity Inflow: The right common iliac artery is patent. The right external and internal iliac arteries are patent. Outflow: There is a nonocclusive embolus at the bifurcation of the common femoral artery into the SFA and profunda femoris. The thrombus appears to be near occlusive involving the proximal profundus femoris artery. The thrombus is nonocclusive involving the proximal right SFA. There is complete occlusion of the distal right SFA/proximal popliteal artery. There is minimal reconstitution at the level of the above knee and below knee popliteal artery. Runoff: There is minimal contrast within the right peroneal artery. No contrast is visualized at the level of the ankle. The proximal anterior tibial and posterior tibial arteries are non-opacified. LEFT Lower Extremity Inflow: There is complete occlusion of the proximal common iliac artery. There is minimal reconstitution at the level of the left external iliac artery. The majority of the left external iliac artery is occluded. The left internal iliac artery is occluded. Outflow: There is reconstitution at the level of the common femoral artery. There is suboptimal opacification throughout the SFA and profunda femoris arteries, likely secondary to contrast bolus timing. Minimal contrast is noted in the popliteal artery. Runoff: There is no contrast within the tibial vasculature. Veins: No obvious venous abnormality within the limitations of this arterial phase study. Review of the MIP images confirms the above findings. NON-VASCULAR Lower chest: There is atelectasis at the lung bases.The heart size is normal. Hepatobiliary: The liver is normal. Normal gallbladder.There is no biliary ductal dilation. Pancreas: Normal contours without ductal dilatation. No peripancreatic fluid collection. Spleen: Unremarkable. Adrenals/Urinary Tract: --Adrenal glands: Unremarkable. --Right kidney/ureter: No hydronephrosis or radiopaque  kidney stones. --Left kidney/ureter: No hydronephrosis or radiopaque kidney stones. --Urinary bladder: Unremarkable. Stomach/Bowel: --Stomach/Duodenum: No hiatal hernia or other gastric abnormality. Normal duodenal course and caliber. --Small bowel: Unremarkable. --Colon: Patient is status post prior surgical anastomosis at the level of the splenic flexure of the colon. The anastomosis appears grossly patent. --Appendix: Normal. Lymphatic: Normal course and caliber of the major abdominal vessels. --No retroperitoneal lymphadenopathy. --No mesenteric lymphadenopathy. --No pelvic or inguinal  lymphadenopathy. Reproductive: The prostate gland is enlarged. Other: No ascites or free air. The abdominal wall is normal. Musculoskeletal. Multilevel degenerative changes are noted throughout the lumbar spine. There is no acute compression fracture. IMPRESSION: 1. Findings consistent with an acute embolic event as detailed above. 2. There is complete occlusion of the left common iliac and left external iliac arteries. No flow is seen at the level of the left ankle. 3. There is an embolus straddling the right SFA and profunda femoris bifurcation. There is complete occlusion of the distal right SFA/proximal popliteal artery. Essentially no flow is visualized to the level of the right ankle. 4. Extensive noncalcified plaque is noted throughout the visualized portions of the thoracic and abdominal aorta. 5. No definite evidence for an end organ perfusion defect within the abdomen. Aortic Atherosclerosis (ICD10-I70.0). These results were called by telephone at the time of interpretation on 08/21/2020 at 8:52 pm to provider Sherwood Gambler , who verbally acknowledged these results. Electronically Signed   By: Constance Holster M.D.   On: 08/21/2020 20:52   HYBRID OR IMAGING ([facility] ONLY)  Result Date: 08/21/2020 There is no interpretation for this exam.  This order is for images obtained during a surgical procedure.  Please See  "Surgeries" Tab for more information regarding the procedure.    Procedures .Critical Care Performed by: Sherwood Gambler, MD Authorized by: Sherwood Gambler, MD   Critical care provider statement:    Critical care time (minutes):  45   Critical care time was exclusive of:  Separately billable procedures and treating other patients   Critical care was necessary to treat or prevent imminent or life-threatening deterioration of the following conditions:  Circulatory failure   Critical care was time spent personally by me on the following activities:  Discussions with consultants, evaluation of patient's response to treatment, examination of patient, ordering and performing treatments and interventions, ordering and review of laboratory studies, ordering and review of radiographic studies, pulse oximetry, re-evaluation of patient's condition, obtaining history from patient or surrogate and review of old charts   (including critical care time)  Medications Ordered in ED Medications  heparin ADULT infusion 100 units/mL (25000 units/246mL sodium chloride 0.45%) (0 Units/hr Intravenous Stopped 08/21/20 2254)  sodium chloride 0.9 % bolus 500 mL (0 mLs Intravenous Stopped 08/21/20 2027)  fentaNYL (SUBLIMAZE) injection 50 mcg (50 mcg Intravenous Given 08/21/20 1938)  iohexol (OMNIPAQUE) 350 MG/ML injection 100 mL (100 mLs Intravenous Contrast Given 08/21/20 1911)  heparin bolus via infusion 2,400 Units (2,400 Units Intravenous Bolus from Bag 08/21/20 2108)    ED Course  I have reviewed the triage vital signs and the nursing notes.  Pertinent labs & imaging results that were available during my care of the patient were reviewed by me and considered in my medical decision making (see chart for details).  Clinical Course as of Aug 22 2311  Wed Aug 21, 2020  1824 Patient seems to indicate this is an acute problem starting at 3:00.  However the son has come in and states that the patient has been  having leg issues for a while.  It does seem worse today but technically his leg has been causing problems with weakness and pain for a while and worse today.  Thus will not call code stroke.  We will investigate why it is weak with CT head.   [SG]    Clinical Course User Index [SG] Sherwood Gambler, MD   MDM Rules/Calculators/A&P  Given the left leg symptoms resolved, it was initially unclear if this was neurologic in etiology versus vascular.  It was very difficult to get pulses in the distal legs, and at first it was unclear if this was a mechanical issue with the Doppler versus true ischemia.  Thus he had a CT head looking for acute head bleed as well as CT angiography.  The head CT is benign.  I have viewed both of the CT test and the CT angiography shows significant occlusion of both his left common iliac and right femoral arteries.  His pain is improved after IV fentanyl.  I discussed with Dr. Carlis Abbott of vascular surgery.  He was started on heparin.  He was emergently transferred to [facility] through the ED for operative repair.  I discussed with EDP at [facility], Dr. Maryan Rued. Final Clinical Impression(s) / ED Diagnoses Final diagnoses:  Iliac artery occlusion, left (HCC)  Femoral artery occlusion, right [facility])    Rx / DC Orders ED Discharge Orders    None       Sherwood Gambler, MD 08/21/20 2344

## 2020-08-21 NOTE — ED Notes (Signed)
Vascular surgeon at bedside.

## 2020-08-21 NOTE — Anesthesia Preprocedure Evaluation (Signed)
Anesthesia Evaluation  Patient identified by MRN, date of birth, ID band Patient awake    Reviewed: Allergy & Precautions, NPO status , Patient's Chart, lab work & pertinent test results  Airway Mallampati: I  TM Distance: >3 FB Neck ROM: Full    Dental   Pulmonary    Pulmonary exam normal        Cardiovascular Normal cardiovascular exam     Neuro/Psych    GI/Hepatic   Endo/Other    Renal/GU      Musculoskeletal   Abdominal   Peds  Hematology   Anesthesia Other Findings   Reproductive/Obstetrics                             Anesthesia Physical Anesthesia Plan  ASA: III and emergent  Anesthesia Plan: General   Post-op Pain Management:    Induction: Intravenous  PONV Risk Score and Plan: 2 and Ondansetron and Midazolam  Airway Management Planned: Oral ETT  Additional Equipment:   Intra-op Plan:   Post-operative Plan:   Informed Consent: I have reviewed the patients History and Physical, chart, labs and discussed the procedure including the risks, benefits and alternatives for the proposed anesthesia with the patient or authorized representative who has indicated his/her understanding and acceptance.       Plan Discussed with: CRNA and Surgeon  Anesthesia Plan Comments:         Anesthesia Quick Evaluation

## 2020-08-21 NOTE — Anesthesia Procedure Notes (Signed)
Procedure Name: Intubation Date/Time: 08/21/2020 10:24 PM Performed by: Jearld Pies, CRNA Pre-anesthesia Checklist: Patient identified, Emergency Drugs available, Suction available and Patient being monitored Patient Re-evaluated:Patient Re-evaluated prior to induction Oxygen Delivery Method: Circle System Utilized Preoxygenation: Pre-oxygenation with 100% oxygen Induction Type: IV induction, Rapid sequence and Cricoid Pressure applied Laryngoscope Size: Mac and 4 Grade View: Grade I Tube type: Oral Tube size: 7.5 mm Number of attempts: 1 Airway Equipment and Method: Stylet Placement Confirmation: ETT inserted through vocal cords under direct vision,  positive ETCO2 and breath sounds checked- equal and bilateral Secured at: 21 cm Tube secured with: Tape Dental Injury: Teeth and Oropharynx as per pre-operative assessment

## 2020-08-22 ENCOUNTER — Encounter (HOSPITAL_COMMUNITY): Payer: Self-pay | Admitting: Vascular Surgery

## 2020-08-22 DIAGNOSIS — D72829 Elevated white blood cell count, unspecified: Secondary | ICD-10-CM | POA: Diagnosis present

## 2020-08-22 DIAGNOSIS — I7409 Other arterial embolism and thrombosis of abdominal aorta: Secondary | ICD-10-CM | POA: Diagnosis present

## 2020-08-22 DIAGNOSIS — Z85038 Personal history of other malignant neoplasm of large intestine: Secondary | ICD-10-CM | POA: Diagnosis not present

## 2020-08-22 DIAGNOSIS — F1010 Alcohol abuse, uncomplicated: Secondary | ICD-10-CM | POA: Diagnosis present

## 2020-08-22 DIAGNOSIS — R569 Unspecified convulsions: Secondary | ICD-10-CM | POA: Diagnosis present

## 2020-08-22 DIAGNOSIS — I745 Embolism and thrombosis of iliac artery: Secondary | ICD-10-CM | POA: Diagnosis present

## 2020-08-22 DIAGNOSIS — R509 Fever, unspecified: Secondary | ICD-10-CM | POA: Diagnosis not present

## 2020-08-22 DIAGNOSIS — N179 Acute kidney failure, unspecified: Secondary | ICD-10-CM | POA: Diagnosis present

## 2020-08-22 DIAGNOSIS — M549 Dorsalgia, unspecified: Secondary | ICD-10-CM | POA: Diagnosis present

## 2020-08-22 DIAGNOSIS — I998 Other disorder of circulatory system: Secondary | ICD-10-CM | POA: Diagnosis present

## 2020-08-22 DIAGNOSIS — F17200 Nicotine dependence, unspecified, uncomplicated: Secondary | ICD-10-CM | POA: Diagnosis present

## 2020-08-22 DIAGNOSIS — Z23 Encounter for immunization: Secondary | ICD-10-CM | POA: Diagnosis not present

## 2020-08-22 DIAGNOSIS — Z87820 Personal history of traumatic brain injury: Secondary | ICD-10-CM | POA: Diagnosis not present

## 2020-08-22 DIAGNOSIS — G8929 Other chronic pain: Secondary | ICD-10-CM | POA: Diagnosis present

## 2020-08-22 DIAGNOSIS — Z20822 Contact with and (suspected) exposure to covid-19: Secondary | ICD-10-CM | POA: Diagnosis present

## 2020-08-22 DIAGNOSIS — I70223 Atherosclerosis of native arteries of extremities with rest pain, bilateral legs: Secondary | ICD-10-CM | POA: Diagnosis present

## 2020-08-22 DIAGNOSIS — R54 Age-related physical debility: Secondary | ICD-10-CM | POA: Diagnosis present

## 2020-08-22 LAB — BASIC METABOLIC PANEL
Anion gap: 20 — ABNORMAL HIGH (ref 5–15)
BUN: 14 mg/dL (ref 8–23)
CO2: 18 mmol/L — ABNORMAL LOW (ref 22–32)
Calcium: 9.7 mg/dL (ref 8.9–10.3)
Chloride: 104 mmol/L (ref 98–111)
Creatinine, Ser: 1.45 mg/dL — ABNORMAL HIGH (ref 0.61–1.24)
GFR, Estimated: 49 mL/min — ABNORMAL LOW (ref >60)
Glucose, Bld: 107 mg/dL — ABNORMAL HIGH (ref 70–99)
Potassium: 4.3 mmol/L (ref 3.5–5.1)
Sodium: 142 mmol/L (ref 135–145)

## 2020-08-22 LAB — CBC
HCT: 55.6 % — ABNORMAL HIGH (ref 39.0–52.0)
Hemoglobin: 18.2 g/dL — ABNORMAL HIGH (ref 13.0–17.0)
MCH: 33 pg (ref 26.0–34.0)
MCHC: 32.7 g/dL (ref 30.0–36.0)
MCV: 100.7 fL — ABNORMAL HIGH (ref 80.0–100.0)
Platelets: 104 10*3/uL — ABNORMAL LOW (ref 150–400)
RBC: 5.52 MIL/uL (ref 4.22–5.81)
RDW: 13.2 % (ref 11.5–15.5)
WBC: 7.4 10*3/uL (ref 4.0–10.5)
nRBC: 0 % (ref 0.0–0.2)

## 2020-08-22 LAB — GLUCOSE, CAPILLARY
Glucose-Capillary: 119 mg/dL — ABNORMAL HIGH (ref 70–99)
Glucose-Capillary: 182 mg/dL — ABNORMAL HIGH (ref 70–99)

## 2020-08-22 LAB — LIPID PANEL
Cholesterol: 172 mg/dL (ref 0–200)
HDL: 64 mg/dL (ref >40)
LDL Cholesterol: 96 mg/dL (ref 0–99)
Total CHOL/HDL Ratio: 2.7 RATIO
Triglycerides: 60 mg/dL (ref <150)
VLDL: 12 mg/dL (ref 0–40)

## 2020-08-22 LAB — HEPARIN LEVEL (UNFRACTIONATED)
Heparin Unfractionated: 0.68 IU/mL (ref 0.30–0.70)
Heparin Unfractionated: 0.64 IU/mL (ref 0.30–0.70)

## 2020-08-22 LAB — MRSA PCR SCREENING: MRSA by PCR: NEGATIVE

## 2020-08-22 MED ORDER — ACETAMINOPHEN 325 MG RE SUPP
325.0000 mg | RECTAL | Status: DC | PRN
  Filled 2020-08-22: qty 2

## 2020-08-22 MED ORDER — ACETAMINOPHEN 325 MG PO TABS
325.0000 mg | ORAL_TABLET | ORAL | Status: DC | PRN
  Administered 2020-08-25 – 2020-08-27 (×3): 650 mg via ORAL
  Filled 2020-08-22 (×3): qty 2

## 2020-08-22 MED ORDER — DOCUSATE SODIUM 100 MG PO CAPS
100.0000 mg | ORAL_CAPSULE | Freq: Every day | ORAL | Status: DC
  Administered 2020-08-23 – 2020-09-02 (×11): 100 mg via ORAL
  Filled 2020-08-22 (×11): qty 1

## 2020-08-22 MED ORDER — GUAIFENESIN-DM 100-10 MG/5ML PO SYRP: 15.0000 mL | ORAL_SOLUTION | ORAL | Status: DC | PRN

## 2020-08-22 MED ORDER — LABETALOL HCL 5 MG/ML IV SOLN
INTRAVENOUS | Status: DC | PRN
  Administered 2020-08-22: 5 mg via INTRAVENOUS
  Administered 2020-08-22: 10 mg via INTRAVENOUS
  Administered 2020-08-22: 5 mg via INTRAVENOUS

## 2020-08-22 MED ORDER — ESMOLOL HCL 100 MG/10ML IV SOLN
INTRAVENOUS | Status: DC | PRN
  Administered 2020-08-22 (×2): 10 mg via INTRAVENOUS

## 2020-08-22 MED ORDER — ROSUVASTATIN CALCIUM 5 MG PO TABS
10.0000 mg | ORAL_TABLET | Freq: Every day | ORAL | Status: DC
  Administered 2020-08-22 – 2020-08-27 (×6): 10 mg via ORAL
  Filled 2020-08-22 (×7): qty 2

## 2020-08-22 MED ORDER — LABETALOL HCL 5 MG/ML IV SOLN
INTRAVENOUS | Status: AC
  Filled 2020-08-22: qty 4

## 2020-08-22 MED ORDER — SODIUM CHLORIDE 0.9 % IV SOLN: 500.0000 mL | Freq: Once | INTRAVENOUS | Status: DC | PRN

## 2020-08-22 MED ORDER — ALUM & MAG HYDROXIDE-SIMETH 200-200-20 MG/5ML PO SUSP: 15.0000 mL | ORAL | Status: DC | PRN

## 2020-08-22 MED ORDER — PHENYLEPHRINE 40 MCG/ML (10ML) SYRINGE FOR IV PUSH (FOR BLOOD PRESSURE SUPPORT)
PREFILLED_SYRINGE | INTRAVENOUS | Status: DC | PRN
  Administered 2020-08-22: 80 ug via INTRAVENOUS

## 2020-08-22 MED ORDER — ONDANSETRON HCL 4 MG/2ML IJ SOLN: 4.0000 mg | Freq: Four times a day (QID) | INTRAMUSCULAR | Status: DC | PRN

## 2020-08-22 MED ORDER — CHLORHEXIDINE GLUCONATE CLOTH 2 % EX PADS
6.0000 | MEDICATED_PAD | Freq: Every day | CUTANEOUS | Status: DC
  Administered 2020-08-23 (×2): 6 via TOPICAL

## 2020-08-22 MED ORDER — ONDANSETRON HCL 4 MG/2ML IJ SOLN: 4.0000 mg | Freq: Once | INTRAMUSCULAR | Status: DC | PRN

## 2020-08-22 MED ORDER — LABETALOL HCL 5 MG/ML IV SOLN: 10.0000 mg | INTRAVENOUS | Status: DC | PRN

## 2020-08-22 MED ORDER — CEFAZOLIN SODIUM-DEXTROSE 2-4 GM/100ML-% IV SOLN
2.0000 g | Freq: Three times a day (TID) | INTRAVENOUS | Status: AC
  Administered 2020-08-22 (×2): 2 g via INTRAVENOUS
  Filled 2020-08-22 (×2): qty 100

## 2020-08-22 MED ORDER — MEPERIDINE HCL 25 MG/ML IJ SOLN: 6.2500 mg | INTRAMUSCULAR | Status: DC | PRN

## 2020-08-22 MED ORDER — EPHEDRINE SULFATE-NACL 50-0.9 MG/10ML-% IV SOSY: PREFILLED_SYRINGE | INTRAVENOUS | Status: DC | PRN

## 2020-08-22 MED ORDER — MAGNESIUM SULFATE 2 GM/50ML IV SOLN: 2.0000 g | Freq: Every day | INTRAVENOUS | Status: DC | PRN

## 2020-08-22 MED ORDER — HYDROMORPHONE HCL 1 MG/ML IJ SOLN: 0.2500 mg | INTRAMUSCULAR | Status: DC | PRN

## 2020-08-22 MED ORDER — PHENOL 1.4 % MT LIQD: 1.0000 | OROMUCOSAL | Status: DC | PRN

## 2020-08-22 MED ORDER — PANTOPRAZOLE SODIUM 40 MG PO TBEC
40.0000 mg | DELAYED_RELEASE_TABLET | Freq: Every day | ORAL | Status: DC
  Administered 2020-08-22 – 2020-09-02 (×12): 40 mg via ORAL
  Filled 2020-08-22 (×12): qty 1

## 2020-08-22 MED ORDER — BISACODYL 10 MG RE SUPP
10.0000 mg | Freq: Every day | RECTAL | Status: DC | PRN
  Administered 2020-08-31: 10 mg via RECTAL
  Filled 2020-08-22: qty 1

## 2020-08-22 MED ORDER — POLYETHYLENE GLYCOL 3350 17 G PO PACK
17.0000 g | PACK | Freq: Every day | ORAL | Status: DC | PRN
  Administered 2020-08-26 – 2020-08-30 (×3): 17 g via ORAL
  Filled 2020-08-22 (×3): qty 1

## 2020-08-22 MED ORDER — MORPHINE SULFATE (PF) 2 MG/ML IV SOLN: 2.0000 mg | INTRAVENOUS | Status: DC | PRN

## 2020-08-22 MED ORDER — LABETALOL HCL 5 MG/ML IV SOLN
5.0000 mg | INTRAVENOUS | Status: AC | PRN
  Administered 2020-08-22 (×2): 5 mg via INTRAVENOUS

## 2020-08-22 MED ORDER — OXYCODONE-ACETAMINOPHEN 5-325 MG PO TABS
1.0000 | ORAL_TABLET | ORAL | Status: DC | PRN
  Administered 2020-08-23 – 2020-08-27 (×2): 2 via ORAL
  Filled 2020-08-22 (×2): qty 2

## 2020-08-22 MED ORDER — HYDRALAZINE HCL 20 MG/ML IJ SOLN: 5.0000 mg | INTRAMUSCULAR | Status: DC | PRN

## 2020-08-22 MED ORDER — HEMOSTATIC AGENTS (NO CHARGE) OPTIME
TOPICAL | Status: DC | PRN
  Administered 2020-08-22: 1 via TOPICAL

## 2020-08-22 MED ORDER — ONDANSETRON HCL 4 MG/2ML IJ SOLN
INTRAMUSCULAR | Status: DC | PRN
  Administered 2020-08-22: 4 mg via INTRAVENOUS

## 2020-08-22 MED ORDER — DEXTROSE-NACL 5-0.45 % IV SOLN: INTRAVENOUS | Status: DC

## 2020-08-22 MED ORDER — METOPROLOL TARTRATE 5 MG/5ML IV SOLN: 2.0000 mg | INTRAVENOUS | Status: DC | PRN

## 2020-08-22 MED ORDER — SUGAMMADEX SODIUM 200 MG/2ML IV SOLN
INTRAVENOUS | Status: DC | PRN
  Administered 2020-08-22: 150 mg via INTRAVENOUS

## 2020-08-22 MED ORDER — POTASSIUM CHLORIDE CRYS ER 20 MEQ PO TBCR
20.0000 meq | EXTENDED_RELEASE_TABLET | Freq: Every day | ORAL | Status: AC | PRN
  Administered 2020-08-26: 20 meq via ORAL
  Filled 2020-08-22: qty 1

## 2020-08-22 NOTE — Op Note (Addendum)
Date: August 22, 2020  Preoperative diagnosis: Acute limb ischemia of bilateral lower extremities  Postoperative diagnosis: Same  Procedure: 1.  Right lower extremity thrombectomy of common femoral, profunda, SFA, popliteal and tibial arteries from right femoral approach 2.  Left iliac artery thrombectomy from left femoral approach 3.  Left lower extremity thrombectomy of common femoral, profunda, SFA from left femoral approach 4.  Bilateral lower extremity 4 compartment fasciotomies  Surgeon: Dr. Marty Heck, MD  Assistant: Risa Grill, PA  Indications: Patient is an 79 year old male who presented to [facility] ED earlier tonight with acute bilateral lower extremity limb ischemia.  He had evidence of significant mural thrombus of his descending aorta as well as his abdominal aorta with evidence of thrombus that embolized to the right common femoral and right popliteal artery as well as the left common and external iliac artery.  He presents emergently after risk benefits discussed.  An assistant was needed for exposure and to expedite the case.  Findings: After cutdown on the right common femoral artery had excellent inflow but no backbleeding.  Ultimately retrieved large plugs of thrombus from the common femoral as well as the SFA and popliteal and I was able to easily pass catheter all the way into the foot.  Brisk right PT signal at completion. On the left there was no femoral pulse.  Ultimately after cutdown retrieved large plugs of fresh appearing thrombus from the common and external iliac with excellent inflow.  I could only get a catheter to pass about 30 cm down the SFA with additional thrombus retrieved.  Brisk left PT signal at completion.  Anesthesia: General  Details: Patient was taken to the operating room after informed consent was obtained.  Placed on operative table in supine position.  General endotracheal anesthesia was induced.  Bilateral groins and both legs  were prepped draped usual sterile fashion.  Initially made a horizontal groin incision in the right groin above the inguinal crease.  Dissected down with bovie cautery and opened the femoral sheath and ultimately the SFA profunda as well as the common femoral were all controlled with Vesseloops.  Patient was given 100 units/kg heparin.  Ultimately I used vessel loops for control and the right common femoral artery was opened transversely with 11 blade scalpel extended with Potts scissors.  I had excellent inflow.  I initially passed #3 Fogarty distally into the common femoral artery and down the SFA multiple times and got large plugs of thrombus from the common femoral as well as the SFA and popliteal artery.  Ultimately I made passes until I had at least several clean passes.  I was able to pass this all the way down to the foot without resistance.  I did the same passing a #3 Fogarty catheter down the profunda achieving additional thrombus.  I had excellent back-bleeding.  The arteriotomy was closed with interrupted 6-0 Prolene's and we had a very brisk PT signal in the right foot at completion.  I then similarly made a incision in the left groin above the inguinal crease.  Dissected down with bovie cautery and opened the left femoral sheath and ultimately the proximal and distal common femoral artery was controlled with Vesseloops.  The left common femoral artery was opened with 11 blade scalpel extended with Potts scissors.  There was no inflow and I was able to pass a #4 Fogarty catheter into the iliac retrograde and after multiple passes got multiple plugs of acute appearing thrombus with very brisk pulsatile  inflow.  There was very little backbleeding and ultimately I was able to pass a #3 Fogarty again down the SFA and profunda and get good backbleeding with additional thrombus.  I could only get a catheter to pass about 30 cm down the SFA before I met resistance.  I suspect he may have chronic occlusion  here.  Ultimately arteriotomy was closed with 6-0 Prolene's in interrupted fashion given I had good back-bleeding.  Again we had a very brisk PT signal in the left foot.  Given he had ischemia time exceeding 8 hours, I elected to perform fasciotomies.  We initially started with the left leg and then the right leg and ultimately a longitudinal lateral incision was made 2 fingerbreadths lateral to the tibia.  I made a transverse incision in the fascia with Bovie cautery and the anterior and lateral compartments were opened with Metzenbaum scissors after identifying the septum.  We then went medially one fingerbreadth medial to the tibia and made a longitudinal incision on each medial calf and opened the posterior compartments.  Ultimately hemostasis was obtained with Bovie cautery.  We did not reverse the patient.  All the muscle was viable and had good contraction.  Both groins were irrigated out and we closed these with multiple layers of 2-0 Vicryl, 3-0 Vicryl, 4-0 Monocryl.  The skin incisions for the fasciotomies were closed with staples.  His legs will be wrapped with Ace bandages to help with compression given he is fairly oozy on heparin.  He had very brisk PT signals in both feet at completion.  Complication: None  Condition: Stable  Marty Heck, MD [facility] Office: [place]

## 2020-08-22 NOTE — Transfer of Care (Signed)
Immediate Anesthesia Transfer of Care Note  Patient: Eddie Hodge  Procedure(s) Performed: THROMBECTOMY BILATERAL FEMORAL ARTERY and Left iliac thrombectomy. (Bilateral Leg Lower) FOUR Compartment FASCIOTOMY (Bilateral Leg Lower)  Patient Location: PACU  Anesthesia Type:General  Level of Consciousness: drowsy, patient cooperative and responds to stimulation  Airway & Oxygen Therapy: Patient Spontanous Breathing and Patient connected to nasal cannula oxygen  Post-op Assessment: Report given to RN and Post -op Vital signs reviewed and stable  Post vital signs: Reviewed and stable  Last Vitals:  Vitals Value Taken Time  BP 177/114 08/22/20 0121  Temp    Pulse 80 08/22/20 0122  Resp 14 08/22/20 0122  SpO2 98 % 08/22/20 0122  Vitals shown include unvalidated device data.  Last Pain:  Vitals:   08/21/20 2154  TempSrc: Oral  PainSc:      Ossey MD informed of HTN in PACU, additional 5mg  labetalol given per MD orders.     Complications: No complications documented.

## 2020-08-22 NOTE — Progress Notes (Signed)
   ASSESSMENT & PLAN:  Eddie Hodge is a 79 y.o. male s/p bilateral lower extremity thrombectomy via groin incisions, bilateral four compartment fasciotomies.   PRN pain control PT/OT/OOB/Ambulate Continue therapeutic anticoagulation with heparin. Diet as tolerated Foley out in AM Monitor incision sites for hematoma / bleeding  SUBJECTIVE:  No complaints.  OBJECTIVE:  BP 132/79   Pulse (!) 116   Temp 98.1 F (36.7 C) (Oral)   Resp 20   Ht 6' (1.829 m)   Wt 69.3 kg   SpO2 100%   BMI 20.72 kg/m   Intake/Output Summary (Last 24 hours) at 08/22/2020 1530 Last data filed at 08/22/2020 1500 Gross per 24 hour  Intake 3420.36 ml  Output 1895 ml  Net 1525.36 ml    Constitutional: Thin elderly man in no acute distress. Cardiac: RRR. Vascular: groins clean and dry. Calf dressings clean and dry. +PT signals bilaterally. Pulmonary: unlabored Abdominal: soft  CBC Latest Ref Rng & Units 08/22/2020 08/21/2020 08/21/2020  WBC 4.0 - 10.5 K/uL 7.4 - 8.2  Hemoglobin 13.0 - 17.0 g/dL 18.2(H) 21.1(HH) 18.8(H)  Hematocrit 39 - 52 % 55.6(H) 62.0(H) 58.0(H)  Platelets 150 - 400 K/uL 104(L) - 121(L)     CMP Latest Ref Rng & Units 08/22/2020 08/21/2020 08/21/2020  Glucose 70 - 99 mg/dL 107(H) 106(H) 107(H)  BUN 8 - 23 mg/dL 14 19 16   Creatinine 0.61 - 1.24 mg/dL 1.45(H) 1.30(H) 1.39(H)  Sodium 135 - 145 mmol/L 142 139 139  Potassium 3.5 - 5.1 mmol/L 4.3 3.7 3.6  Chloride 98 - 111 mmol/L 104 102 102  CO2 22 - 32 mmol/L 18(L) - 24  Calcium 8.9 - 10.3 mg/dL 9.7 - 9.9  Total Protein 6.5 - 8.1 g/dL - - 7.4  Total Bilirubin 0.3 - 1.2 mg/dL - - 1.8(H)  Alkaline Phos 38 - 126 U/L - - 91  AST 15 - 41 U/L - - 28  ALT 0 - 44 U/L - - 15    Estimated Creatinine Clearance: 39.8 mL/min (A) (by C-G formula based on SCr of 1.45 mg/dL (H)).  Eddie Hodge. Eddie Breed, MD [facility] Phone Number: [phone] 08/22/2020 3:30 PM

## 2020-08-22 NOTE — Progress Notes (Signed)
[place] for heparin Indication: embolic arterial occlusion of legs  No Known Allergies  Patient Measurements: Height: 6' (182.9 cm) Weight: 69.3 kg (152 lb 12.5 oz) IBW/kg (Calculated) : 77.6 Heparin Dosing Weight: n/a. Use TBW = 74 kg  Vital Signs: Temp: 99.1 F (37.3 C) (11/25 2000) Temp Source: Oral (11/25 2000) BP: 118/79 (11/25 2100) Pulse Rate: 107 (11/25 2100)  Labs: Recent Labs    08/21/20 1821 08/21/20 1821 08/21/20 1836 08/22/20 0412 08/22/20 0414 08/22/20 1208 08/22/20 2119  HGB 18.8*   < > 21.1* 18.2*  --   --   --   HCT 58.0*  --  62.0* 55.6*  --   --   --   PLT 121*  --   --  104*  --   --   --   APTT 30  --   --   --   --   --   --   LABPROT 13.4  --   --   --   --   --   --   INR 1.1  --   --   --   --   --   --   HEPARINUNFRC  --   --   --   --   --  0.68 0.64  CREATININE 1.39*  --  1.30*  --  1.45*  --   --   CKTOTAL 35*  --   --   --   --   --   --    < > = values in this interval not displayed.    Estimated Creatinine Clearance: 39.8 mL/min (A) (by C-G formula based on SCr of 1.45 mg/dL (H)).   Medical History: History reviewed. No pertinent past medical history.  Assessment: 79 yo M presents with bilateral limb ischemia now s/p thrombectomy of RLE, LLE, and L iliac artery. Pharmacy consulted to dose heparin.   Confirmatory heparin level remains therapeutic at 0.64 units/mL.  No bleeding nor hematoma reported.  Goal of Therapy:  Heparin level 0.3-0.7 units/ml Monitor platelets by anticoagulation protocol: Yes   Plan:  Reduce heparin gtt slightly to 1250 units/hr F/U AM labs  Adore Kithcart D. Mina Marble, PharmD, BCPS, [place] 08/22/2020, 9:51 PM

## 2020-08-22 NOTE — Progress Notes (Signed)
[place] for heparin Indication: embolic arterial occlusion of legs  No Known Allergies  Patient Measurements: Height: 6' (182.9 cm) Weight: 69.3 kg (152 lb 12.5 oz) IBW/kg (Calculated) : 77.6 Heparin Dosing Weight: n/a. Use TBW = 74 kg  Vital Signs: Temp: 98.1 F (36.7 C) (11/25 1200) Temp Source: Oral (11/25 1200) BP: 123/76 (11/25 1200) Pulse Rate: 102 (11/25 1200)  Labs: Recent Labs    08/21/20 1821 08/21/20 1821 08/21/20 1836 08/22/20 0412 08/22/20 0414 08/22/20 1208  HGB 18.8*   < > 21.1* 18.2*  --   --   HCT 58.0*  --  62.0* 55.6*  --   --   PLT 121*  --   --  104*  --   --   APTT 30  --   --   --   --   --   LABPROT 13.4  --   --   --   --   --   INR 1.1  --   --   --   --   --   HEPARINUNFRC  --   --   --   --   --  0.68  CREATININE 1.39*  --  1.30*  --  1.45*  --   CKTOTAL 35*  --   --   --   --   --    < > = values in this interval not displayed.    Estimated Creatinine Clearance: 39.8 mL/min (A) (by C-G formula based on SCr of 1.45 mg/dL (H)).   Medical History: History reviewed. No pertinent past medical history.  Assessment: 79 yo M presents with bilateral limb ischemia now s/p thrombectomy of RLE, LLE, and L iliac artery. Pharmacy consulted to dose heparin. No AC noted PTA.  Initial heparin level therapeutic at 0.68 on drip rate 1300 units/hr. Of note, 14,000 units of heparin given in OR overnight, no reversal. CBC ok postop. No overt bleeding or infusion issues noted. CrCl ~40 ml/min.   Goal of Therapy:  Heparin level 0.3-0.7 units/ml Monitor platelets by anticoagulation protocol: Yes   Plan:  Continue heparin at 1300 units/hr Check confirmatory 8-hr HL Monitor daily HL, CBC, s/sx bleeding  Richardine Service, PharmD, BCPS PGY2 Cardiology Pharmacy Resident Phone: [phone] 08/22/2020  1:03 PM  Please check [email or web address] for unit-specific pharmacy phone numbers.

## 2020-08-22 NOTE — Progress Notes (Signed)
[place] for heparin Indication: embolic arterial occlusion of legs  No Known Allergies  Patient Measurements: Height: 6' (182.9 cm) Weight: 74.4 kg (164 lb) IBW/kg (Calculated) : 77.6 Heparin Dosing Weight: n/a. Use TBW = 74 kg  Vital Signs: Temp: 97 F (36.1 C) (11/25 0118) Temp Source: Oral (11/24 2154) BP: 147/92 (11/25 0218) Pulse Rate: 77 (11/25 0218)  Labs: Recent Labs    08/21/20 1821 08/21/20 1836  HGB 18.8* 21.1*  HCT 58.0* 62.0*  PLT 121*  --   APTT 30  --   LABPROT 13.4  --   INR 1.1  --   CREATININE 1.39* 1.30*  CKTOTAL 35*  --     Estimated Creatinine Clearance: 47.7 mL/min (A) (by C-G formula based on SCr of 1.3 mg/dL (H)).   Medical History: History reviewed. No pertinent past medical history.  Assessment: Pharmacy consulted to dose/monitor heparin in this 69 yoM with arterial occlusion of leg.  Heparin to resume postop.  14,000 units given in OR  Goal of Therapy:  Heparin level 0.3-0.7 units/ml Monitor platelets by anticoagulation protocol: Yes   Plan:  Continue heparin at 1300 units/hr Check heparin level at 12:00  Shatima Zalar, Alex Gardener, PharmD 08/22/2020,2:44 AM

## 2020-08-22 NOTE — Anesthesia Postprocedure Evaluation (Signed)
Anesthesia Post Note  Patient: Eddie Hodge  Procedure(s) Performed: THROMBECTOMY BILATERAL FEMORAL ARTERY and Left iliac thrombectomy. (Bilateral Leg Lower) FOUR Compartment FASCIOTOMY (Bilateral Leg Lower)     Patient location during evaluation: PACU Anesthesia Type: General Level of consciousness: awake and alert Pain management: pain level controlled Vital Signs Assessment: post-procedure vital signs reviewed and stable Respiratory status: spontaneous breathing, nonlabored ventilation, respiratory function stable and patient connected to nasal cannula oxygen Cardiovascular status: blood pressure returned to baseline and stable Postop Assessment: no apparent nausea or vomiting Anesthetic complications: no   No complications documented.  Last Vitals:  Vitals:   08/22/20 0216 08/22/20 0218  BP: (!) 149/96 (!) 147/92  Pulse: 71 77  Resp:  (!) 22  Temp:    SpO2: 99% 96%    Last Pain:  Vitals:   08/22/20 0140  TempSrc:   PainSc: [place]

## 2020-08-23 ENCOUNTER — Inpatient Hospital Stay (HOSPITAL_COMMUNITY): Payer: Medicare Other

## 2020-08-23 DIAGNOSIS — I998 Other disorder of circulatory system: Secondary | ICD-10-CM | POA: Diagnosis not present

## 2020-08-23 LAB — COMPREHENSIVE METABOLIC PANEL
ALT: 10 U/L (ref 0–44)
AST: 30 U/L (ref 15–41)
Albumin: 2.5 g/dL — ABNORMAL LOW (ref 3.5–5.0)
Alkaline Phosphatase: 51 U/L (ref 38–126)
Anion gap: 11 (ref 5–15)
BUN: 18 mg/dL (ref 8–23)
CO2: 24 mmol/L (ref 22–32)
Calcium: 8.8 mg/dL — ABNORMAL LOW (ref 8.9–10.3)
Chloride: 103 mmol/L (ref 98–111)
Creatinine, Ser: 2.02 mg/dL — ABNORMAL HIGH (ref 0.61–1.24)
GFR, Estimated: 33 mL/min — ABNORMAL LOW (ref >60)
Glucose, Bld: 106 mg/dL — ABNORMAL HIGH (ref 70–99)
Potassium: 3.8 mmol/L (ref 3.5–5.1)
Sodium: 138 mmol/L (ref 135–145)
Total Bilirubin: 1.3 mg/dL — ABNORMAL HIGH (ref 0.3–1.2)
Total Protein: 5.4 g/dL — ABNORMAL LOW (ref 6.5–8.1)

## 2020-08-23 LAB — CBC
HCT: 38.6 % — ABNORMAL LOW (ref 39.0–52.0)
HCT: 34.2 % — ABNORMAL LOW (ref 39.0–52.0)
Hemoglobin: 12.7 g/dL — ABNORMAL LOW (ref 13.0–17.0)
Hemoglobin: 11.2 g/dL — ABNORMAL LOW (ref 13.0–17.0)
MCH: 33.1 pg (ref 26.0–34.0)
MCH: 32.8 pg (ref 26.0–34.0)
MCHC: 32.9 g/dL (ref 30.0–36.0)
MCHC: 32.7 g/dL (ref 30.0–36.0)
MCV: 100.5 fL — ABNORMAL HIGH (ref 80.0–100.0)
MCV: 100.3 fL — ABNORMAL HIGH (ref 80.0–100.0)
Platelets: 127 10*3/uL — ABNORMAL LOW (ref 150–400)
Platelets: 132 10*3/uL — ABNORMAL LOW (ref 150–400)
RBC: 3.84 MIL/uL — ABNORMAL LOW (ref 4.22–5.81)
RBC: 3.41 MIL/uL — ABNORMAL LOW (ref 4.22–5.81)
RDW: 12.9 % (ref 11.5–15.5)
RDW: 12.9 % (ref 11.5–15.5)
WBC: 12.5 10*3/uL — ABNORMAL HIGH (ref 4.0–10.5)
WBC: 13.6 10*3/uL — ABNORMAL HIGH (ref 4.0–10.5)
nRBC: 0 % (ref 0.0–0.2)
nRBC: 0 % (ref 0.0–0.2)

## 2020-08-23 LAB — BASIC METABOLIC PANEL
Anion gap: 13 (ref 5–15)
BUN: 18 mg/dL (ref 8–23)
CO2: 23 mmol/L (ref 22–32)
Calcium: 9 mg/dL (ref 8.9–10.3)
Chloride: 100 mmol/L (ref 98–111)
Creatinine, Ser: 1.88 mg/dL — ABNORMAL HIGH (ref 0.61–1.24)
GFR, Estimated: 36 mL/min — ABNORMAL LOW (ref >60)
Glucose, Bld: 120 mg/dL — ABNORMAL HIGH (ref 70–99)
Potassium: 3.9 mmol/L (ref 3.5–5.1)
Sodium: 136 mmol/L (ref 135–145)

## 2020-08-23 LAB — PHOSPHORUS: Phosphorus: 1.9 mg/dL — ABNORMAL LOW (ref 2.5–4.6)

## 2020-08-23 LAB — HEPARIN LEVEL (UNFRACTIONATED): Heparin Unfractionated: 0.51 IU/mL (ref 0.30–0.70)

## 2020-08-23 LAB — ECHOCARDIOGRAM COMPLETE
Area-P 1/2: 6.71 cm2
Height: 72 in
S' Lateral: 2.6 cm
Weight: 2444.46 oz

## 2020-08-23 LAB — MAGNESIUM: Magnesium: 1.3 mg/dL — ABNORMAL LOW (ref 1.7–2.4)

## 2020-08-23 LAB — GLUCOSE, CAPILLARY: Glucose-Capillary: 119 mg/dL — ABNORMAL HIGH (ref 70–99)

## 2020-08-23 MED ORDER — THIAMINE HCL 100 MG PO TABS: 100.0000 mg | ORAL_TABLET | Freq: Every day | ORAL | Status: DC

## 2020-08-23 MED ORDER — THIAMINE HCL 100 MG/ML IJ SOLN
100.0000 mg | Freq: Every day | INTRAMUSCULAR | Status: DC
  Filled 2020-08-23: qty 2

## 2020-08-23 MED ORDER — THIAMINE HCL 100 MG PO TABS
100.0000 mg | ORAL_TABLET | Freq: Every day | ORAL | Status: DC
  Administered 2020-08-23 – 2020-09-02 (×11): 100 mg via ORAL
  Filled 2020-08-23 (×11): qty 1

## 2020-08-23 MED ORDER — LORAZEPAM 2 MG/ML IJ SOLN: 1.0000 mg | INTRAMUSCULAR | Status: DC | PRN

## 2020-08-23 MED ORDER — LORAZEPAM 2 MG/ML IJ SOLN: 1.0000 mg | INTRAMUSCULAR | Status: AC | PRN

## 2020-08-23 MED ORDER — LORAZEPAM 1 MG PO TABS: 1.0000 mg | ORAL_TABLET | ORAL | Status: AC | PRN

## 2020-08-23 MED ORDER — ADULT MULTIVITAMIN W/MINERALS CH
1.0000 | ORAL_TABLET | Freq: Every day | ORAL | Status: DC
  Administered 2020-08-23 – 2020-09-02 (×11): 1 via ORAL
  Filled 2020-08-23 (×11): qty 1

## 2020-08-23 MED ORDER — FOLIC ACID 1 MG PO TABS
1.0000 mg | ORAL_TABLET | Freq: Every day | ORAL | Status: DC
  Administered 2020-08-23 – 2020-09-02 (×11): 1 mg via ORAL
  Filled 2020-08-23 (×11): qty 1

## 2020-08-23 MED ORDER — FOLIC ACID 1 MG PO TABS: 1.0000 mg | ORAL_TABLET | Freq: Every day | ORAL | Status: DC

## 2020-08-23 MED ORDER — ADULT MULTIVITAMIN W/MINERALS CH: 1.0000 | ORAL_TABLET | Freq: Every day | ORAL | Status: DC

## 2020-08-23 NOTE — Evaluation (Signed)
Physical Therapy Evaluation Patient Details Name: Eddie Hodge MRN: [id number] DOB: Aug 02, 1941 Today's Date: 08/23/2020   History of Present Illness  Pt is an 79 year old man with PMH of colon cancer and alcohol abuse who was admitted on 08/22/20 with B LE critical ischemia. Underwent B femoral artery thrombectomy, L iliac thrombectomy and 4 compartment fasciotomies B LEs.  Clinical Impression  Pt admitted with above diagnosis. Pt was able to ambulate with RW but limited distance due to instability on feet as well as HR incr to 155 bpm.  Nurse made aware of high HR as well as bil LEs continued to bleed even through dressings.  Pt HR recovered once he sat down.  Pt will need SNF to reach greater independence prior to d/c home.  Pt currently with functional limitations due to the deficits listed below (see PT Problem List). Pt will benefit from skilled PT to increase their independence and safety with mobility to allow discharge to the venue listed below.      Follow Up Recommendations SNF;Supervision/Assistance - 24 hour    Equipment Recommendations  Other (comment);Rolling walker with 5" wheels;3in1 (PT) (TBA)    Recommendations for Other Services       Precautions / Restrictions Precautions Precautions: Fall Precaution Comments: bil LE bleeding noted with dressings in place Restrictions Weight Bearing Restrictions: No      Mobility  Bed Mobility Overal bed mobility: Needs Assistance Bed Mobility: Supine to Sit     Supine to sit: Min guard     General bed mobility comments: No assist to EOB. Pt slightly impulsive.     Transfers Overall transfer level: Needs assistance Equipment used: Rolling walker (2 wheeled) Transfers: Sit to/from Stand Sit to Stand: Min assist         General transfer comment: Pt needed a little assist to power up and steady once up.  Unsteady on feet.  Ambulation/Gait Ambulation/Gait assistance: Mod assist;Min assist;+2 safety/equipment Gait  Distance (Feet): 25 Feet Assistive device: Rolling walker (2 wheeled) Gait Pattern/deviations: Step-to pattern;Decreased step length - right;Decreased step length - left;Decreased stride length;Antalgic;Staggering right;Narrow base of support   Gait velocity interpretation: <1.31 ft/sec, indicative of household ambulator General Gait Details: Pt generally unsteady on his feet with assist to steer RW and assist for posture.  HR incr to 155 bpm with activity therefore notifited MD.    Stairs            Wheelchair Mobility    Modified Rankin (Stroke Patients Only)       Balance Overall balance assessment: Needs assistance Sitting-balance support: No upper extremity supported;Feet supported Sitting balance-Leahy Scale: Fair     Standing balance support: Bilateral upper extremity supported;During functional activity Standing balance-Leahy Scale: Poor Standing balance comment: relies on UE support                             Pertinent Vitals/Pain Pain Assessment: No/denies pain    Home Living Family/patient expects to be discharged to:: Private residence Living Arrangements: Children (son) Available Help at Discharge: Family;Available 24 hours/day Type of Home: House Home Access: Stairs to enter   [employer] of Steps: 2 Home Layout: One level Home Equipment: Hand held shower head;Grab bars - tub/shower      Prior Function Level of Independence: Independent               Hand Dominance        Extremity/Trunk Assessment  Upper Extremity Assessment Upper Extremity Assessment: Defer to OT evaluation    Lower Extremity Assessment Lower Extremity Assessment: Generalized weakness    Cervical / Trunk Assessment Cervical / Trunk Assessment: Normal  Communication   Communication: No difficulties  Cognition Arousal/Alertness: Awake/alert Behavior During Therapy: WFL for tasks assessed/performed Overall Cognitive Status: Within  Functional Limits for tasks assessed                                        General Comments General comments (skin integrity, edema, etc.): O2 sats >90% on RA, HR 119-155 bpm with activity.     Exercises     Assessment/Plan    PT Assessment Patient needs continued PT services  PT Problem List Decreased activity tolerance;Decreased balance;Decreased mobility;Decreased knowledge of use of DME;Decreased safety awareness;Decreased knowledge of precautions;Cardiopulmonary status limiting activity       PT Treatment Interventions DME instruction;Gait training;Functional mobility training;Therapeutic activities;Therapeutic exercise;Balance training;Patient/family education    PT Goals (Current goals can be found in the Care Plan section)  Acute Rehab PT Goals Patient Stated Goal: to go to Rehab and then home PT Goal Formulation: With patient Time For Goal Achievement: 09/06/20 Potential to Achieve Goals: Good    Frequency Min 3X/week   Barriers to discharge        Co-evaluation PT/OT/SLP Co-Evaluation/Treatment: Yes Reason for Co-Treatment: Complexity of the patient's impairments (multi-system involvement);For patient/therapist safety PT goals addressed during session: Mobility/safety with mobility         AM-PAC PT "6 Clicks" Mobility  Outcome Measure Help needed turning from your back to your side while in a flat bed without using bedrails?: None Help needed moving from lying on your back to sitting on the side of a flat bed without using bedrails?: None Help needed moving to and from a bed to a chair (including a wheelchair)?: A Little Help needed standing up from a chair using your arms (e.g., wheelchair or bedside chair)?: A Little Help needed to walk in hospital room?: A Lot Help needed climbing 3-5 steps with a railing? : A Lot 6 Click Score: 18    End of Session Equipment Utilized During Treatment: Gait belt Activity Tolerance: Patient limited by  fatigue Patient left: in chair;with call bell/phone within reach;with chair alarm set Nurse Communication: Mobility status (incr HR with activity) PT Visit Diagnosis: Unsteadiness on feet (R26.81)    Time: 5809-9833 PT Time Calculation (min) (ACUTE ONLY): 20 min   Charges:   PT Evaluation $PT Eval Moderate Complexity: 1 Mod          Eddie Hodge W,PT Acute Rehabilitation Services Pager:  [phone]  Office:  [phone]    Denice Paradise 08/23/2020, 12:58 PM

## 2020-08-23 NOTE — Progress Notes (Signed)
[facility] [place]  Subjective  -no acute events overnight.   Objective 130/73 85 99.1 F (37.3 C) (Oral) 16 96%  Intake/Output Summary (Last 24 hours) at 08/23/2020 0954 Last data filed at 08/23/2020 0800 Gross per 24 hour  Intake 2556.46 ml  Output 500 ml  Net 2056.46 ml    Bilateral groin incisions are clean dry and intact.  He does have a fair amount of ecchymosis to the right groin but no sizeable hematoma and very soft Bilateral PT signals are very brisk and near triphasic also very brisk right DP Bilateral lower extremity fasciotomy incisions are closed with staples and this all appears viable with no large hematoma and soft compartments  Laboratory Lab Results: Recent Labs    08/22/20 0412 08/23/20 0104  WBC 7.4 12.5*  HGB 18.2* 12.7*  HCT 55.6* 38.6*  PLT 104* 127*   BMET Recent Labs    08/22/20 0414 08/23/20 0104  NA 142 136  K 4.3 3.9  CL 104 100  CO2 18* 23  GLUCOSE 107* 120*  BUN 14 18  CREATININE 1.45* 1.88*  CALCIUM 9.7 9.0    COAG Lab Results  Component Value Date   INR 1.1 08/21/2020   INR 0.9 02/12/2009   INR 1.1 12/10/2008   No results found for: PTT  Assessment/Planning:  79 year old male postop day 2 status post bilateral lower extremity thrombectomies and left iliac thrombectomy from bilateral femoral approach for acute limb ischemia of bilateral lower extremities.  He also required bilateral lower extremity fasciotomies.  He has very brisk PT signals bilaterally as well as a right DP signal and both lower extremities seem well perfused.  Continue heparin appreciate pharmacy input.  I did change his fasciotomy dressings today and these are closed with staples.  He was fairly oozy at the end of the case given heparin so we will continue Ace bandages for compression on the fasciotomies.  Echocardiogram ordered today for completion of embolic work-up but suspect this is likely from mural thrombus in his  descending thoracic and abdominal aorta.  Can transfer out of the ICU to the floor.  Hemoglobin today is 12.7.  He does have a mild AKI with creatinine increased from 1.45 to 1.88.  Urine output has been 500 cc in the last 24 hours.  Will keep IVF.  We will keep Foley and monitor UOP.  Overall seems to be doing well.  Lower extremities are motor and sensory intact.  Marty Heck 08/23/2020 9:54 AM --

## 2020-08-23 NOTE — Progress Notes (Signed)
Vascular surgery at the bedside, they are changing dressing to BIl legs. Left continues to ooze through dressing. MD wrapped ace wrap tighter to help with drainage. They report if it continues to bleed through dressings, to notify Vascular surgery on call.

## 2020-08-23 NOTE — Progress Notes (Signed)
[place] for heparin Indication: embolic arterial occlusion of legs  No Known Allergies  Patient Measurements: Height: 6' (182.9 cm) Weight: 69.3 kg (152 lb 12.5 oz) IBW/kg (Calculated) : 77.6 Heparin Dosing Weight: n/a. Use TBW = 74 kg  Vital Signs: Temp: 99.1 F (37.3 C) (11/26 0813) Temp Source: Oral (11/26 0813) BP: 130/73 (11/26 0800) Pulse Rate: 85 (11/26 0800)  Labs: Recent Labs    08/21/20 1821 08/21/20 1821 08/21/20 1836 08/21/20 1836 08/22/20 0412 08/22/20 0414 08/22/20 1208 08/22/20 2119 08/23/20 0104  HGB 18.8*   < > 21.1*   < > 18.2*  --   --   --  12.7*  HCT 58.0*   < > 62.0*  --  55.6*  --   --   --  38.6*  PLT 121*  --   --   --  104*  --   --   --  127*  APTT 30  --   --   --   --   --   --   --   --   LABPROT 13.4  --   --   --   --   --   --   --   --   INR 1.1  --   --   --   --   --   --   --   --   HEPARINUNFRC  --   --   --   --   --   --  0.68 0.64 0.51  CREATININE 1.39*   < > 1.30*  --   --  1.45*  --   --  1.88*  CKTOTAL 35*  --   --   --   --   --   --   --   --    < > = values in this interval not displayed.    Estimated Creatinine Clearance: 30.7 mL/min (A) (by C-G formula based on SCr of 1.88 mg/dL (H)).   Medical History: History reviewed. No pertinent past medical history.  Assessment: 79 yo M presents with bilateral limb ischemia now s/p thrombectomy of RLE, LLE, and L iliac artery. Pharmacy consulted to dose heparin.   Heparin level therapeutic at 0.51 on drip rate 1250 units/hr. Hgb down from 18 to 12.7 this AM, platelets 127 stable. Per discussion with nursing, right groin site is still oozing but is manageable/stable with compression wraps. Will continue current heparin rate and monitor bleeding closely.  Goal of Therapy:  Heparin level 0.3-0.7 units/ml Monitor platelets by anticoagulation protocol: Yes   Plan:  Continue heparin infusion at 1250 units/hr Monitor daily HL, CBC, s/sx  bleeding  Richardine Service, PharmD, BCPS PGY2 Cardiology Pharmacy Resident Phone: [phone] 08/23/2020  9:30 AM  Please check [email or web address] for unit-specific pharmacy phone numbers.

## 2020-08-23 NOTE — Plan of Care (Signed)

## 2020-08-23 NOTE — Evaluation (Signed)
Occupational Therapy Evaluation Patient Details Name: Eddie Hodge MRN: [id number] DOB: Nov 18, 1940 Today's Date: 08/23/2020    History of Present Illness Pt is an 79 year old man with PMH of colon cancer and alcohol abuse who was admitted on 08/22/20 with B LE critical ischemia. Underwent B femoral artery thrombectomy, L iliac thrombectomy and 4 compartment fasciotomies B LEs.   Clinical Impression   Pt was independent prior to admission. Presents with unsteadiness on his feet requiring up to moderate assistance for OOB and set up to moderate assistance for ADL. Pt with impulsivity and restless during session with HR to 157 with activity, 118 at rest. RN aware. Recommending ST rehab in SNF prior to going home as pt's son can only provide intermittent supervision. Will follow acutely.    Follow Up Recommendations  SNF;Supervision/Assistance - 24 hour    Equipment Recommendations  3 in 1 bedside commode    Recommendations for Other Services       Precautions / Restrictions Precautions Precautions: Fall Precaution Comments: watch HR Restrictions Weight Bearing Restrictions: No      Mobility Bed Mobility Overal bed mobility: Needs Assistance Bed Mobility: Supine to Sit     Supine to sit: Min guard     General bed mobility comments: No assist to EOB. Pt slightly impulsive without regard for lines.    Transfers Overall transfer level: Needs assistance Equipment used: Rolling walker (2 wheeled) Transfers: Sit to/from Stand Sit to Stand: Min assist         General transfer comment: Pt needed a little assist to power up and steady once up.  Unsteady on feet.    Balance Overall balance assessment: Needs assistance Sitting-balance support: No upper extremity supported;Feet supported Sitting balance-Leahy Scale: Fair     Standing balance support: Bilateral upper extremity supported;During functional activity Standing balance-Leahy Scale: Poor Standing balance  comment: relies on UE support and external assist                           ADL either performed or assessed with clinical judgement   ADL Overall ADL's : Needs assistance/impaired Eating/Feeding: Independent   Grooming: Set up;Sitting   Upper Body Bathing: Set up;Sitting   Lower Body Bathing: Moderate assistance;Sit to/from stand   Upper Body Dressing : Set up;Sitting   Lower Body Dressing: Moderate assistance;Sit to/from stand   Toilet Transfer: Moderate assistance;Ambulation;RW   Toileting- Clothing Manipulation and Hygiene: Minimal assistance;Sit to/from stand       Functional mobility during ADLs: Moderate assistance;Rolling walker General ADL Comments: Pt can access his feet in sitting, but needs mod assist for standing balance to complete LB bathing and dressing.     Vision Baseline Vision/History: Wears glasses Patient Visual Report: No change from baseline       Perception     Praxis      Pertinent Vitals/Pain Pain Assessment: No/denies pain Pain Location: LEs     Hand Dominance Right   Extremity/Trunk Assessment Upper Extremity Assessment Upper Extremity Assessment: Overall WFL for tasks assessed   Lower Extremity Assessment Lower Extremity Assessment: Defer to PT evaluation   Cervical / Trunk Assessment Cervical / Trunk Assessment: Normal   Communication Communication Communication: No difficulties   Cognition Arousal/Alertness: Awake/alert Behavior During Therapy: WFL for tasks assessed/performed Overall Cognitive Status: Within Functional Limits for tasks assessed  General Comments  O2 sats >90% on RA, HR 118-157 bpm with activity.     Exercises     Shoulder Instructions      Home Living Family/patient expects to be discharged to:: Private residence Living Arrangements: Children (son) Available Help at Discharge: Family;Available 24 hours/day Type of Home: House Home  Access: Stairs to enter [employer] of Steps: 2   Home Layout: One level     Bathroom Shower/Tub: Teacher, early years/pre: Handicapped height     Home Equipment: Hand held shower head;Grab bars - tub/shower          Prior Functioning/Environment Level of Independence: Independent                 OT Problem List: Decreased strength;Decreased activity tolerance;Impaired balance (sitting and/or standing);Decreased knowledge of use of DME or AE;Cardiopulmonary status limiting activity      OT Treatment/Interventions: Self-care/ADL training;DME and/or AE instruction;Therapeutic activities;Patient/family education;Balance training    OT Goals(Current goals can be found in the care plan section) Acute Rehab OT Goals Patient Stated Goal: to go to Rehab and then home OT Goal Formulation: With patient Time For Goal Achievement: 09/06/20 Potential to Achieve Goals: Good ADL Goals Pt Will Perform Grooming: with supervision;standing Pt Will Perform Lower Body Bathing: with supervision;sit to/from stand Pt Will Perform Lower Body Dressing: with supervision;sit to/from stand Pt Will Transfer to Toilet: with supervision;ambulating;bedside commode (over toilet) Pt Will Perform Toileting - Clothing Manipulation and hygiene: with supervision;sit to/from stand  OT Frequency: Min 2X/week   Barriers to D/C:            Co-evaluation PT/OT/SLP Co-Evaluation/Treatment: Yes Reason for Co-Treatment: Complexity of the patient's impairments (multi-system involvement) PT goals addressed during session: Mobility/safety with mobility OT goals addressed during session: ADL's and self-care;Proper use of Adaptive equipment and DME      AM-PAC OT "6 Clicks" Daily Activity     Outcome Measure Help from another person eating meals?: None Help from another person taking care of personal grooming?: A Little Help from another person toileting, which includes using toliet,  bedpan, or urinal?: A Lot Help from another person bathing (including washing, rinsing, drying)?: A Lot Help from another person to put on and taking off regular upper body clothing?: A Little Help from another person to put on and taking off regular lower body clothing?: A Lot 6 Click Score: 16   End of Session Equipment Utilized During Treatment: Gait belt;Rolling walker Nurse Communication: Other (comment) (elevated HR)  Activity Tolerance: Treatment limited secondary to medical complications (Comment) (HR to 157 with activity) Patient left: in chair;with call bell/phone within reach;with chair alarm set  OT Visit Diagnosis: Unsteadiness on feet (R26.81);Other abnormalities of gait and mobility (R26.89);Muscle weakness (generalized) (M62.81)                Time: 0165-5374 OT Time Calculation (min): 20 min Charges:  OT General Charges $OT Visit: 1 Visit OT Evaluation $OT Eval Moderate Complexity: 1 Mod  Nestor Lewandowsky, OTR/L Acute Rehabilitation Services Pager: [phone] Office: [phone]  Malka So 08/23/2020, 1:10 PM

## 2020-08-23 NOTE — Progress Notes (Signed)
Paged Vascular Surgery. Pt continues to bleed through dressing to left leg x2. Awaiting call back.

## 2020-08-23 NOTE — Progress Notes (Signed)
Per my assessment, pt is noted to have a Level 2 (moderate) to the right groin d/t the presence of a palpable hematoma > 3cm. There is bruising around the groin bilaterally, however, the right groin does appear to have a larger hematoma then the left groin. Bruising does not reach the posterior side of the patient and the patient is not complaining of pain in the retroperitoneal area.

## 2020-08-23 NOTE — Progress Notes (Signed)
  Echocardiogram 2D Echocardiogram has been performed.  Eddie Hodge 08/23/2020, 11:08 AM

## 2020-08-24 LAB — HEPARIN LEVEL (UNFRACTIONATED)
Heparin Unfractionated: 0.21 IU/mL — ABNORMAL LOW (ref 0.30–0.70)
Heparin Unfractionated: <0.1 IU/mL — ABNORMAL LOW (ref 0.30–0.70)

## 2020-08-24 LAB — CBC
HCT: 31.2 % — ABNORMAL LOW (ref 39.0–52.0)
Hemoglobin: 10.3 g/dL — ABNORMAL LOW (ref 13.0–17.0)
MCH: 32.9 pg (ref 26.0–34.0)
MCHC: 33 g/dL (ref 30.0–36.0)
MCV: 99.7 fL (ref 80.0–100.0)
Platelets: 111 10*3/uL — ABNORMAL LOW (ref 150–400)
RBC: 3.13 MIL/uL — ABNORMAL LOW (ref 4.22–5.81)
RDW: 12.7 % (ref 11.5–15.5)
WBC: 13.9 10*3/uL — ABNORMAL HIGH (ref 4.0–10.5)
nRBC: 0 % (ref 0.0–0.2)

## 2020-08-24 MED ORDER — APIXABAN 5 MG PO TABS
5.0000 mg | ORAL_TABLET | Freq: Two times a day (BID) | ORAL | Status: DC
  Administered 2020-08-24 – 2020-09-02 (×19): 5 mg via ORAL
  Filled 2020-08-24 (×2): qty 1
  Filled 2020-08-24: qty 2
  Filled 2020-08-24 (×16): qty 1

## 2020-08-24 NOTE — TOC Initial Note (Signed)
Transition of Care [facility]) - Initial/Assessment Note    Patient Details  Name: Eddie Hodge MRN: [id number] Date of Birth: Jul 24, 1941  Transition of Care [facility]) CM/SW Contact:    Emeterio Reeve, [place] Phone Number: 08/24/2020, 12:45 PM  Clinical Narrative:                 CSW met with pt at bedside. CSW introduced self and explained her role at the hospital. Pt stated PTA he was living at home with his son and was independent. Pt reports he was walking independently and doing all ADL's. Pt is not covid vaccinated.   CSW discussed pt/ot reccs for SNF. Pt stated he will go to SNF if he has to. Pt has no preference in facilities. CSW gave pt medicare.gov list to review. CSW will fax pt out to facilities in the area.   TOC will follow   Expected Discharge Plan: [place] Barriers to Discharge: Continued Medical Work up   Patient Goals and CMS Choice Patient states their goals for this hospitalization and ongoing recovery are:: TO get stronger and retrn home. CMS Medicare.gov Compare Post Acute Care list provided to:: Patient Choice offered to / list presented to : Patient  Expected Discharge Plan and Services Expected Discharge Plan: [place]       Living arrangements for the past 2 months: Single Family Home                                      Prior Living Arrangements/Services Living arrangements for the past 2 months: Single Family Home Lives with:: Adult Children Patient language and need for interpreter reviewed:: Yes Do you feel safe going back to the place where you live?: Yes        Care giver support system in place?: Yes (comment)   Criminal Activity/Legal Involvement Pertinent to Current Situation/Hospitalization: No - Comment as needed  Activities of Daily Living   ADL Screening (condition at time of admission) Patient's cognitive ability adequate to safely complete daily activities?: Yes Is the patient deaf or have  difficulty hearing?: Yes Does the patient have difficulty seeing, even when wearing glasses/contacts?: No Does the patient have difficulty concentrating, remembering, or making decisions?: No Patient able to express need for assistance with ADLs?: Yes Does the patient have difficulty dressing or bathing?: No Independently performs ADLs?: Yes (appropriate for developmental age) Does the patient have difficulty walking or climbing stairs?: Yes Weakness of Legs: Both Weakness of Arms/Hands: None  Permission Sought/Granted   Permission granted to share information with : Yes, Verbal Permission Granted     Permission granted to share info w AGENCY: SNF        Emotional Assessment Appearance:: Appears stated age Attitude/Demeanor/Rapport: Engaged Affect (typically observed): Appropriate Orientation: : Oriented to Self, Oriented to Place, Oriented to  Time, Oriented to Situation Alcohol / Substance Use: Not Applicable Psych Involvement: No (comment)  Admission diagnosis:  Lower limb ischemia [I99.8] Ischemia of lower extremity [I99.8] Patient Active Problem List   Diagnosis Date Noted  . Lower limb ischemia 08/22/2020  . Ischemia of lower extremity 08/22/2020  . EDEMA 04/04/2009  . COLON CANCER 04/03/2009  . ALCOHOL ABUSE 04/03/2009  . ANISOCORIA 04/03/2009  . BRADYCARDIA 04/03/2009  . SUBDURAL HEMATOMA, CHRONIC 04/03/2009   PCP:  Patient, No Pcp Per Pharmacy:   [facility] [place], [place]  [address] [place] [address] Phone: [phone] Fax: [phone]     Social Determinants of Health (SDOH) Interventions    Readmission Risk Interventions No flowsheet data found.  Emeterio Reeve, Latanya Presser, [place] Social Worker [phone]

## 2020-08-24 NOTE — NC FL2 (Signed)
[place] LEVEL OF CARE SCREENING TOOL     IDENTIFICATION  Patient Name: Eddie Hodge Birthdate: [birth year] Sex: male Admission Date (Current Location): 08/21/2020  [facility] [place] Number:  Herbalist and Address:  [facility], [place] [address], [place], [place] [address]      Provider Number: [id number]  Attending Physician Name and Address:  Marty Heck, MD  Relative Name and Phone Number:       Current Level of Care: Hospital Recommended Level of Care: [place] Prior Approval Number:    Date Approved/Denied:   PASRR Number: [id number]  Discharge Plan:      Current Diagnoses: Patient Active Problem List   Diagnosis Date Noted  . Lower limb ischemia 08/22/2020  . Ischemia of lower extremity 08/22/2020  . EDEMA 04/04/2009  . COLON CANCER 04/03/2009  . ALCOHOL ABUSE 04/03/2009  . ANISOCORIA 04/03/2009  . BRADYCARDIA 04/03/2009  . SUBDURAL HEMATOMA, CHRONIC 04/03/2009    Orientation RESPIRATION BLADDER Height & Weight     Self, Time, Situation, Place  Normal Continent Weight: 152 lb 12.5 oz (69.3 kg) Height:  6' (182.9 cm)  BEHAVIORAL SYMPTOMS/MOOD NEUROLOGICAL BOWEL NUTRITION STATUS      Continent Diet (See discharge summary)  AMBULATORY STATUS COMMUNICATION OF NEEDS Skin   Extensive Assist Verbally Normal, Skin abrasions                       Personal Care Assistance Level of Assistance  Dressing, Feeding, Bathing Bathing Assistance: Maximum assistance Feeding assistance: Limited assistance Dressing Assistance: Maximum assistance     Functional Limitations Info  Sight, Hearing, Speech Sight Info: Adequate   Speech Info: Adequate    SPECIAL CARE FACTORS FREQUENCY  PT (By licensed PT), OT (By licensed OT)     PT Frequency: 5x a week OT Frequency: 5x a week            Contractures Contractures Info: Not present    Additional Factors Info  Code Status,  Allergies Code Status Info: Full Allergies Info: NKA           Current Medications (08/24/2020):  This is the current hospital active medication list Current Facility-Administered Medications  Medication Dose Route Frequency Provider Last Rate Last Admin  . 0.9 %  sodium chloride infusion  500 mL Intravenous Once PRN Dagoberto Ligas, PA-C      . acetaminophen (TYLENOL) tablet 325-650 mg  325-650 mg Oral Q4H PRN Dagoberto Ligas, PA-C       Or  . acetaminophen (TYLENOL) suppository 325-650 mg  325-650 mg Rectal Q4H PRN Dagoberto Ligas, PA-C      . alum & mag hydroxide-simeth (MAALOX/MYLANTA) 200-200-20 MG/5ML suspension 15-30 mL  15-30 mL Oral Q2H PRN Dagoberto Ligas, PA-C      . apixaban (ELIQUIS) tablet 5 mg  5 mg Oral BID Cherre Robins, MD   5 mg at 08/24/20 1211  . bisacodyl (DULCOLAX) suppository 10 mg  10 mg Rectal Daily PRN Dagoberto Ligas, PA-C      . Chlorhexidine Gluconate Cloth 2 % PADS 6 each  6 each Topical Daily Dagoberto Ligas, PA-C   6 each at 08/23/20 2200  . dextrose 5 %-0.45 % sodium chloride infusion   Intravenous Continuous Dagoberto Ligas, PA-C 100 mL/hr at 08/24/20 0503 New Bag at 08/24/20 0503  . docusate sodium (COLACE) capsule 100 mg  100 mg Oral Daily Dagoberto Ligas, PA-C   100 mg  at 08/24/20 1040  . folic acid (FOLVITE) tablet 1 mg  1 mg Oral Daily Laurence Slate M, PA-C   1 mg at 08/24/20 1039  . guaiFENesin-dextromethorphan (ROBITUSSIN DM) 100-10 MG/5ML syrup 15 mL  15 mL Oral Q4H PRN Dagoberto Ligas, PA-C      . hydrALAZINE (APRESOLINE) injection 5 mg  5 mg Intravenous Q20 Min PRN Dagoberto Ligas, PA-C      . labetalol (NORMODYNE) injection 10 mg  10 mg Intravenous Q10 min PRN Dagoberto Ligas, PA-C      . LORazepam (ATIVAN) injection 1-2 mg  1-2 mg Intravenous Q1H PRN Ulyses Amor, PA-C      . LORazepam (ATIVAN) tablet 1-4 mg  1-4 mg Oral Q1H PRN Ulyses Amor, PA-C       Or  . LORazepam (ATIVAN) injection 1-4 mg  1-4 mg Intravenous Q1H PRN  Laurence Slate M, PA-C      . magnesium sulfate IVPB 2 g 50 mL  2 g Intravenous Daily PRN Dagoberto Ligas, PA-C      . metoprolol tartrate (LOPRESSOR) injection 2-5 mg  2-5 mg Intravenous Q2H PRN Dagoberto Ligas, PA-C      . morphine 2 MG/ML injection 2 mg  2 mg Intravenous Q4H PRN Dagoberto Ligas, PA-C      . multivitamin with minerals tablet 1 tablet  1 tablet Oral Daily Laurence Slate M, PA-C   1 tablet at 08/24/20 1040  . ondansetron (ZOFRAN) injection 4 mg  4 mg Intravenous Q6H PRN Dagoberto Ligas, PA-C      . oxyCODONE-acetaminophen (PERCOCET/ROXICET) 5-325 MG per tablet 1-2 tablet  1-2 tablet Oral Q4H PRN Dagoberto Ligas, PA-C   2 tablet at 08/23/20 0039  . pantoprazole (PROTONIX) EC tablet 40 mg  40 mg Oral Daily Dagoberto Ligas, PA-C   40 mg at 08/24/20 1040  . phenol (CHLORASEPTIC) mouth spray 1 spray  1 spray Mouth/Throat PRN Dagoberto Ligas, PA-C      . polyethylene glycol (MIRALAX / GLYCOLAX) packet 17 g  17 g Oral Daily PRN Dagoberto Ligas, PA-C      . potassium chloride SA (KLOR-CON) CR tablet 20-40 mEq  20-40 mEq Oral Daily PRN Dagoberto Ligas, PA-C      . rosuvastatin (CRESTOR) tablet 10 mg  10 mg Oral Daily Dagoberto Ligas, PA-C   10 mg at 08/24/20 1040  . thiamine tablet 100 mg  100 mg Oral Daily Laurence Slate M, PA-C   100 mg at 08/24/20 1040   Or  . thiamine (B-1) injection 100 mg  100 mg Intravenous Daily Ulyses Amor, PA-C         Discharge Medications: Please see discharge summary for a list of discharge medications.  Relevant Imaging Results:  Relevant Lab Results:   Additional Information SSN [id number]  Emeterio Reeve, [place]

## 2020-08-24 NOTE — Progress Notes (Signed)
   08/24/20 1350  Assess: MEWS Score  Temp 98.6 F (37 C)  BP 133/74  Pulse Rate (!) 118  ECG Heart Rate (!) 113  Resp 13  Level of Consciousness Alert  SpO2 99 %  O2 Device Room Air  Assess: MEWS Score  MEWS Temp 0  MEWS Systolic 0  MEWS Pulse 2  MEWS RR 1  MEWS LOC 0  MEWS Score 3  MEWS Score Color Yellow  Assess: if the MEWS score is Yellow or Red  Were vital signs taken at a resting state? Yes  Focused Assessment No change from prior assessment  Early Detection of Sepsis Score *See Row Information* Low  MEWS guidelines implemented *See Row Information* Yes  Treat  MEWS Interventions Escalated (See documentation below)  Pain Scale Faces  Faces Pain Scale 4  Pain Type Surgical pain  Pain Location Leg  Pain Orientation Left;Right  Pain Descriptors / Indicators Discomfort  Pain Onset On-going  Pain Intervention(s) Food  Multiple Pain Sites No  Take Vital Signs  Increase Vital Sign Frequency  Yellow: Q 2hr X 2 then Q 4hr X 2, if remains yellow, continue Q 4hrs  Escalate  MEWS: Escalate Yellow: discuss with charge nurse/RN and consider discussing with provider and RRT  Notify: Charge Nurse/RN  Name of Charge Nurse/RN Notified Jen RN  Date Charge Nurse/RN Notified 08/24/20  Time Charge Nurse/RN Notified 1660   Patient's MEWS turned yellow due to increased HR. Agricultural consultant notified. Increased VS implemented. Will continue to monitor closely.   Gailen Shelter RN

## 2020-08-24 NOTE — Progress Notes (Signed)
[facility] [place] for heparin Indication: embolic arterial occlusion of legs  No Known Allergies  Patient Measurements: Height: 6' (182.9 cm) Weight: 69.3 kg (152 lb 12.5 oz) IBW/kg (Calculated) : 77.6 Heparin Dosing Weight: n/a. Use TBW = 74 kg  Vital Signs: Temp: 100.1 F (37.8 C) (11/27 0748) Temp Source: Oral (11/27 0748) BP: 147/72 (11/27 0748) Pulse Rate: 100 (11/27 0748)  Labs: Recent Labs    08/21/20 1821 08/21/20 1836 08/22/20 0412 08/22/20 0414 08/22/20 1208 08/22/20 2119 08/23/20 0104 08/23/20 0104 08/23/20 1516 08/24/20 0158  HGB 18.8*   < >   < >  --   --   --  12.7*   < > 11.2* 10.3*  HCT 58.0*   < >   < >  --   --   --  38.6*  --  34.2* 31.2*  PLT 121*   < >   < >  --   --   --  127*  --  132* 111*  APTT 30  --   --   --   --   --   --   --   --   --   LABPROT 13.4  --   --   --   --   --   --   --   --   --   INR 1.1  --   --   --   --   --   --   --   --   --   HEPARINUNFRC  --   --   --   --    < > 0.64 0.51  --   --  0.21*  CREATININE 1.39*   < >  --  1.45*  --   --  1.88*  --  2.02*  --   CKTOTAL 35*  --   --   --   --   --   --   --   --   --    < > = values in this interval not displayed.    Estimated Creatinine Clearance: 28.6 mL/min (A) (by C-G formula based on SCr of 2.02 mg/dL (H)).   Medical History: History reviewed. No pertinent past medical history.  Assessment: 79 yo M presents with bilateral limb ischemia now s/p thrombectomy of RLE, LLE, and L iliac artery. Pharmacy consulted to dose heparin.   Per overnight nursing: Was bleeding through bandages yesterday afternoon but that has resolved, IV got pulled out overnight though and there was significant bleeding with that. RN reports that the way the pt is lying she keeps having to restart the infusion.  Today bleeding is controlled and well wrapped. Hgb continues to drop, platelets stable. Transitioning to apixaban. Nursing aware to discontinue heparin  on apixaban administration. Heparin stop time in.  Goal of Therapy:  Heparin level 0.3-0.7 units/ml Monitor platelets by anticoagulation protocol: Yes   Plan:  Initiate apixaban D/c heparin drip Monitor CBC, s/sx bleeding  Norina Buzzard, PharmD PGY1 Pharmacy Resident 08/24/2020 11:02 AM  Please check [email or web address] for unit-specific pharmacy phone numbers.

## 2020-08-24 NOTE — Progress Notes (Addendum)
[facility] [place]  Subjective  - No new complaints.   Objective (!) 147/72 100 100.1 F (37.8 C) (Oral) 20 100%  Intake/Output Summary (Last 24 hours) at 08/24/2020 0801 Last data filed at 08/24/2020 0008 Gross per 24 hour  Intake 911.38 ml  Output 1500 ml  Net -588.62 ml    B LE dressings clean and dry Doppler signals B PT brisk B Groins soft without hematoma Heart RRR Lungs non labored breathing   FINDINGS  Left Ventricle: Left ventricular ejection fraction, by estimation, is 60  to 65%. The left ventricle has normal function. The left ventricle has no  regional wall motion abnormalities. The left ventricular internal cavity  size was normal in size. There is  no left ventricular hypertrophy. Left ventricular diastolic parameters  are consistent with Grade I diastolic dysfunction (impaired relaxation).  Normal left ventricular filling pressure.   Right Ventricle: The right ventricular size is normal. No increase in  right ventricular wall thickness. Right ventricular systolic function is  normal. There is normal pulmonary artery systolic pressure. The tricuspid  regurgitant velocity is 2.42 m/s, and  with an assumed right atrial pressure of 3 mmHg, the estimated right  ventricular systolic pressure is 53.2 mmHg.   Left Atrium: Left atrial size was normal in size.   Right Atrium: Right atrial size was normal in size.   Pericardium: There is no evidence of pericardial effusion.   Mitral Valve: The mitral valve is normal in structure. No evidence of  mitral valve regurgitation. No evidence of mitral valve stenosis.   Tricuspid Valve: The tricuspid valve is normal in structure. Tricuspid  valve regurgitation is trivial. No evidence of tricuspid stenosis.   Aortic Valve: The aortic valve is normal in structure. Aortic valve  regurgitation is not visualized. No aortic stenosis is present.   Pulmonic Valve: The pulmonic valve was  normal in structure. Pulmonic valve  regurgitation is not visualized. No evidence of pulmonic stenosis.   Aorta: The aortic root is normal in size and structure.   Venous: The inferior vena cava is normal in size with greater than 50%  respiratory variability, suggesting right atrial pressure of 3 mmHg.   IAS/Shunts: No atrial level shunt detected by color flow Doppler.   Assessment/Planning: POD # 3 status post bilateral lower extremity thrombectomies and left iliac thrombectomy from bilateral femoral approach for acute limb ischemia of bilateral lower extremities.   Patent arterial flow to B LE Echo performed without evidence of thrombus Heparin continued for anticoagulation CR 2.02 increased will maintain IV fluids, Urine op 1500 will continue to monitor.  He did receive contrast for CTA 08/21/20. Pending CTA chest once Cr decreses PT/OT recommending SNF order placed for transition of care to assist with SNF placement. History of substance abuse/alcohol Withdrawal protocol in place.  HGB stable 10.3 no active bleeding from incisions   Roxy Horseman 08/24/2020 8:01 AM --  Laboratory Lab Results: Recent Labs    08/23/20 1516 08/24/20 0158  WBC 13.6* 13.9*  HGB 11.2* 10.3*  HCT 34.2* 31.2*  PLT 132* 111*   BMET Recent Labs    08/23/20 0104 08/23/20 1516  NA 136 138  K 3.9 3.8  CL 100 103  CO2 23 24  GLUCOSE 120* 106*  BUN 18 18  CREATININE 1.88* 2.02*  CALCIUM 9.0 8.8*    COAG Lab Results  Component Value Date   INR 1.1 08/21/2020   INR 0.9 02/12/2009   INR 1.1 12/10/2008  No results found for: PTT  VASCULAR STAFF ADDENDUM: I have independently interviewed and examined the patient. I agree with the above.  Mild confusion this AM for me. Redirectable. Strong DS in BLE PTs Groins soft, clean, and dry. Fasciotomy dressings dry. Continue therapeutic anticoagulation. Transition to [place].  Continue CIWA for EtOH abuse. Discharge planning - will  need SNF.  [name]. Stanford Breed, MD [facility] Phone Number: [phone] 08/24/2020 11:11 AM

## 2020-08-25 LAB — CBC
HCT: 27.6 % — ABNORMAL LOW (ref 39.0–52.0)
Hemoglobin: 9.1 g/dL — ABNORMAL LOW (ref 13.0–17.0)
MCH: 32.9 pg (ref 26.0–34.0)
MCHC: 33 g/dL (ref 30.0–36.0)
MCV: 99.6 fL (ref 80.0–100.0)
Platelets: 145 10*3/uL — ABNORMAL LOW (ref 150–400)
RBC: 2.77 MIL/uL — ABNORMAL LOW (ref 4.22–5.81)
RDW: 12.7 % (ref 11.5–15.5)
WBC: 13.7 10*3/uL — ABNORMAL HIGH (ref 4.0–10.5)
nRBC: 0 % (ref 0.0–0.2)

## 2020-08-25 NOTE — Progress Notes (Addendum)
[facility] [place]  Subjective  - No new complaints.   Objective (!) 144/67 (!) 105 100.2 F (37.9 C) (Oral) 19 100%  Intake/Output Summary (Last 24 hours) at 08/25/2020 0803 Last data filed at 08/25/2020 0645 Gross per 24 hour  Intake 1800 ml  Output 1550 ml  Net 250 ml    Doppler signals DP/PT B Left foot dorsum with blister and B edema, dressing claen and dry  B groins soft without hematoma and intact incisions Heart RRR Lungs non labored breathing  Assessment/Planning: POD # 4  status post bilateral lower extremity thrombectomies and left iliac thrombectomy from bilateral femoral approachfor acute limb ischemia of bilateral lower extremities.   Patent arterial flow B LE Will re wrap ace dressing this am to assist edema from forming in the feet B, leave blister intact. Pending D/C to SNF History of substance abuse/alcohol Withdrawal protocol in place. BMEt in am  Roxy Horseman 08/25/2020 8:03 AM --  Laboratory Lab Results: Recent Labs    08/24/20 0158 08/25/20 0141  WBC 13.9* 13.7*  HGB 10.3* 9.1*  HCT 31.2* 27.6*  PLT 111* 145*   BMET Recent Labs    08/23/20 0104 08/23/20 1516  NA 136 138  K 3.9 3.8  CL 100 103  CO2 23 24  GLUCOSE 120* 106*  BUN 18 18  CREATININE 1.88* 2.02*  CALCIUM 9.0 8.8*    COAG Lab Results  Component Value Date   INR 1.1 08/21/2020   INR 0.9 02/12/2009   INR 1.1 12/10/2008   No results found for: PTT  VASCULAR STAFF ADDENDUM: I have independently interviewed and examined the patient. I agree with the above.  Local care to fasciotomy sites. Disposition planning - will need SNF.  [name]. Stanford Breed, MD [facility] Phone Number: [phone] 08/25/2020 8:42 AM

## 2020-08-26 LAB — BASIC METABOLIC PANEL
Anion gap: 11 (ref 5–15)
BUN: 15 mg/dL (ref 8–23)
CO2: 22 mmol/L (ref 22–32)
Calcium: 8.5 mg/dL — ABNORMAL LOW (ref 8.9–10.3)
Chloride: 103 mmol/L (ref 98–111)
Creatinine, Ser: 1.79 mg/dL — ABNORMAL HIGH (ref 0.61–1.24)
GFR, Estimated: 38 mL/min — ABNORMAL LOW (ref >60)
Glucose, Bld: 177 mg/dL — ABNORMAL HIGH (ref 70–99)
Potassium: 3.3 mmol/L — ABNORMAL LOW (ref 3.5–5.1)
Sodium: 136 mmol/L (ref 135–145)

## 2020-08-26 LAB — SURGICAL PATHOLOGY

## 2020-08-26 MED ORDER — APIXABAN 5 MG PO TABS: 5.0000 mg | ORAL_TABLET | Freq: Two times a day (BID) | ORAL | 5 refills | Status: AC

## 2020-08-26 MED ORDER — ROSUVASTATIN CALCIUM 10 MG PO TABS
10.0000 mg | ORAL_TABLET | Freq: Every day | ORAL | 2 refills | Status: DC
Start: 2020-08-27 — End: 2020-08-27

## 2020-08-26 MED ORDER — OXYCODONE-ACETAMINOPHEN 5-325 MG PO TABS: 1.0000 | ORAL_TABLET | ORAL | 0 refills | Status: DC | PRN

## 2020-08-26 MED ORDER — FOLIC ACID 1 MG PO TABS: 1.0000 mg | ORAL_TABLET | Freq: Every day | ORAL | 5 refills | Status: AC

## 2020-08-26 MED ORDER — THIAMINE HCL 100 MG PO TABS: 100.0000 mg | ORAL_TABLET | Freq: Every day | ORAL | 3 refills | Status: AC

## 2020-08-26 NOTE — Discharge Instructions (Addendum)
[facility]  Discharge instructions  Lower Extremity Bypass Surgery  Please refer to the following instruction for your post-procedure care. Your surgeon or physician assistant will discuss any changes with you.  Activity  You are encouraged to walk as much as you can. You can slowly return to normal activities during the month after your surgery. Avoid strenuous activity and heavy lifting until your doctor tells you it's OK. Avoid activities such as vacuuming or swinging a golf club. Do not drive until your doctor give the OK and you are no longer taking prescription pain medications. It is also normal to have difficulty with sleep habits, eating and bowel movement after surgery. These will go away with time.  Bathing/Showering  You may shower after you go home. Do not soak in a bathtub, hot tub, or swim until the incision heals completely.  Incision Care  Clean your incision with mild soap and water. Shower every day. Pat the area dry with a clean towel. You do not need a bandage unless otherwise instructed. Do not apply any ointments or creams to your incision. If you have open wounds you will be instructed how to care for them or a visiting nurse may be arranged for you. If you have staples or sutures along your incision they will be removed at your post-op appointment. You may have skin glue on your incision. Do not peel it off. It will come off on its own in about one week. If you have a great deal of moisture in your groin, use a gauze help keep this area dry.  Diet  Resume your normal diet. There are no special food restrictions following this procedure. A low fat/ low cholesterol diet is recommended for all patients with vascular disease. In order to heal from your surgery, it is CRITICAL to get adequate nutrition. Your body requires vitamins, minerals, and protein. Vegetables are the best source of vitamins and minerals. Vegetables also provide the  perfect balance of protein. Processed food has little nutritional value, so try to avoid this.  Medications  Resume taking all your medications unless your doctor or nurse practitioner tells you not to. If your incision is causing pain, you may take over-the-counter pain relievers such as acetaminophen (Tylenol). If you were prescribed a stronger pain medication, please aware these medication can cause nausea and constipation. Prevent nausea by taking the medication with a snack or meal. Avoid constipation by drinking plenty of fluids and eating foods with high amount of fiber, such as fruits, vegetables, and grains. Take Colase 100 mg (an over-the-counter stool softener) twice a day as needed for constipation.   Do not take Tylenol if you are taking prescription pain medications.  Follow Up  Our office will schedule a follow up appointment 2-3 weeks following discharge.  Please call us immediately for any of the following conditions  .Severe or worsening pain in your legs or feet while at rest or while walking .Increase pain, redness, warmth, or drainage (pus) from your incision site(s) Fever of 101 degree or higher The swelling in your leg with the bypass suddenly worsens and becomes more painful than when you were in the hospital If you have been instructed to feel your graft pulse then you should do so every day. If you can no longer feel this pulse, call the office immediately. Not all patients are given this instruction.  Leg swelling is common after leg bypass surgery.  The swelling should improve over a  few months following surgery. To improve the swelling, you may elevate your legs above the level of your heart while you are sitting or resting. Your surgeon or physician assistant may ask you to apply an ACE wrap or wear compression (TED) stockings to help to reduce swelling.  Reduce your risk of vascular disease  Stop smoking. If you would like help call [facility] at 1-800-QUIT-NOW  (212) 295-6099) or [facility] at [phone].  Manage your cholesterol Maintain a desired weight Control your diabetes weight Control your diabetes Keep your blood pressure down  If you have any questions, please call the office at [phone] Information on my medicine - ELIQUIS (apixaban)  Why was Eliquis prescribed for you? Eliquis was prescribed to treat blood clots that may have been found in the arteries of your legs and to reduce the risk of them occurring again.  What do You need to know about Eliquis ? Your current dose is ONE 5 mg tablet taken TWICE daily.  Eliquis may be taken with or without food.   Try to take the dose about the same time in the morning and in the evening. If you have difficulty swallowing the tablet whole please discuss with your pharmacist how to take the medication safely.  Take Eliquis exactly as prescribed and DO NOT stop taking Eliquis without talking to the doctor who prescribed the medication.  Stopping may increase your risk of developing a new blood clot.  Refill your prescription before you run out.  After discharge, you should have regular check-up appointments with your healthcare provider that is prescribing your Eliquis.    What do you do if you miss a dose? If a dose of ELIQUIS is not taken at the scheduled time, take it as soon as possible on the same day and twice-daily administration should be resumed. The dose should not be doubled to make up for a missed dose.  Important Safety Information A possible side effect of Eliquis is bleeding. You should call your healthcare provider right away if you experience any of the following: ? Bleeding from an injury or your nose that does not stop. ? Unusual colored urine (red or dark brown) or unusual colored stools (red or black). ? Unusual bruising for unknown reasons. ? A serious fall or if you hit your head (even if there is no bleeding).  Some medicines may interact with  Eliquis and might increase your risk of bleeding or clotting while on Eliquis. To help avoid this, consult your healthcare provider or pharmacist prior to using any new prescription or non-prescription medications, including herbals, vitamins, non-steroidal anti-inflammatory drugs (NSAIDs) and supplements.  This website has more information on Eliquis (apixaban): http://www.eliquis.com/eliquis/home    SEIZURE PRECAUTIONS Per [facility] statutes, patients with seizures are not allowed to drive until they have been seizure-free for six months.   Use caution when using heavy equipment or power tools. Avoid working on ladders or at heights. Take showers instead of baths. Ensure the water temperature is not too high on the home water heater. Do not go swimming alone. Do not lock yourself in a room alone (i.e. bathroom). When caring for infants or small children, sit down when holding, feeding, or changing them to minimize risk of injury to the child in the event you have a seizure. Maintain good sleep hygiene. Avoid alcohol.   If patienthas another seizure, call 911 and bring them back to the ED if: A. The seizure lasts longer than 5 minutes.  B. The  patient doesn't wake shortly after the seizure or has new problems such as difficulty seeing, speaking or moving following the seizure C. The patient was injured during the seizure D. The patient has a temperature over 102 F (39C) E. The patient vomited during the seizure and now is having trouble breathing

## 2020-08-26 NOTE — Progress Notes (Signed)
Physical Therapy Treatment Patient Details Name: Eddie Hodge MRN: [id number] DOB: 1941/02/14 Today's Date: 08/26/2020    History of Present Illness Pt is an 79 year old man with PMH of colon cancer and alcohol abuse who was admitted on 08/22/20 with B LE critical ischemia. Underwent B femoral artery thrombectomy, L iliac thrombectomy and 4 compartment fasciotomies B LEs.    PT Comments    Pt with increased ambulation tolerance today without noted drainage from bilat LEs. Pt initially very dependent on UEs but then progressed to tolerate increased bilat LE WBing which ultimately improved step length and ambulation endurance. Continue to recommend SNF upon d/c as pt lives alone and continues to be at an increased falls risk. Pt to benefit from SNF to achieve safe mod I level of function for safe transition home. Acute PT to con't to follow.    Follow Up Recommendations  SNF;Supervision/Assistance - 24 hour     Equipment Recommendations  Other (comment);Rolling walker with 5" wheels;3in1 (PT)    Recommendations for Other Services       Precautions / Restrictions Precautions Precautions: Fall Precaution Comments: watch HR Restrictions Weight Bearing Restrictions: No    Mobility  Bed Mobility Overal bed mobility: Needs Assistance Bed Mobility: Supine to Sit     Supine to sit: Supervision     General bed mobility comments: HOB elevated, no physical assist needed  Transfers Overall transfer level: Needs assistance Equipment used: Rolling walker (2 wheeled) Transfers: Sit to/from Stand Sit to Stand: Min assist         General transfer comment: verbal cues for proper hand placement, minA to power up and steady during transition of hands, pt does better when able to push up from arm rests of chair  Ambulation/Gait Ambulation/Gait assistance: Min assist (2nd person helpful for chair follow) Gait Distance (Feet): 40 Feet (x1, 100x1) Assistive device: Rolling walker (2  wheeled) Gait Pattern/deviations: Step-through pattern;Decreased stride length;Trunk flexed Gait velocity: variant Gait velocity interpretation: <1.31 ft/sec, indicative of household ambulator General Gait Details: pt initially very heavy on UE support, so much that pt requested to sit due to "My right arm is going to give out." Pt instructed to put more weight through bilat LEs and less through UEs and pt able to amb 100' with RW and increased pace and stride length, pt reports minimal bilat LE pain   Stairs             Wheelchair Mobility    Modified Rankin (Stroke Patients Only)       Balance Overall balance assessment: Needs assistance Sitting-balance support: No upper extremity supported;Feet supported Sitting balance-Leahy Scale: Good     Standing balance support: Bilateral upper extremity supported;During functional activity Standing balance-Leahy Scale: Poor Standing balance comment: relies on UE support and external assist                            Cognition Arousal/Alertness: Awake/alert Behavior During Therapy: WFL for tasks assessed/performed Overall Cognitive Status: Within Functional Limits for tasks assessed                                 General Comments: pt very HOH limiting comprehension of commands at times      Exercises      General Comments General comments (skin integrity, edema, etc.): HR up to 130sbpm, SpO2 >95%  Pertinent Vitals/Pain Pain Assessment: 0-10 Pain Score: 3  Pain Location: bilat LEs Pain Descriptors / Indicators: Discomfort    Home Living                      Prior Function            PT Goals (current goals can now be found in the care plan section) Progress towards PT goals: Progressing toward goals    Frequency    Min 3X/week      PT Plan Current plan remains appropriate    Co-evaluation              AM-PAC PT "6 Clicks" Mobility   Outcome Measure  Help  needed turning from your back to your side while in a flat bed without using bedrails?: None Help needed moving from lying on your back to sitting on the side of a flat bed without using bedrails?: None Help needed moving to and from a bed to a chair (including a wheelchair)?: A Little Help needed standing up from a chair using your arms (e.g., wheelchair or bedside chair)?: A Little Help needed to walk in hospital room?: A Lot Help needed climbing 3-5 steps with a railing? : A Little 6 Click Score: 19    End of Session Equipment Utilized During Treatment: Gait belt Activity Tolerance: Patient limited by fatigue Patient left: in chair;with call bell/phone within reach;with chair alarm set Nurse Communication: Mobility status PT Visit Diagnosis: Unsteadiness on feet (R26.81)     Time: 7048-8891 PT Time Calculation (min) (ACUTE ONLY): 21 min  Charges:  $Gait Training: 8-22 mins                     Kittie Plater, PT, DPT Acute Rehabilitation Services Pager #: [phone] Office #: [phone]    Berline Lopes 08/26/2020, 11:30 AM

## 2020-08-26 NOTE — TOC Progression Note (Signed)
Transition of Care [facility]) - Progression Note    Patient Details  Name: Eddie Hodge MRN: [id number] Date of Birth: Oct 26, 1940  Transition of Care [facility]) CM/SW [place], [place] Phone Number: 08/26/2020, 10:59 AM  Clinical Narrative:     CSW spoke with pt's son and they have chosen [facility]. CSW called Narda Rutherford and she advised that he will need to be put on a waiting list. Pt's son asked that dr. Jerl Santos see him while visiting his father, nurse alerted. SW will continue to follow for DC.   Expected Discharge Plan: [place] Barriers to Discharge: Continued Medical Work up  Expected Discharge Plan and Services Expected Discharge Plan: [place] arrangements for the past 2 months: Single Family Home                                       Social Determinants of Health (SDOH) Interventions    Readmission Risk Interventions No flowsheet data found.

## 2020-08-26 NOTE — Care Management Important Message (Signed)
Important Message  Patient Details  Name: Eddie Hodge MRN: [id number] Date of Birth: [birth year]   Medicare Important Message Given:  Yes     [name] 08/26/2020, 9:25 AM

## 2020-08-26 NOTE — Discharge Summary (Addendum)
[facility] Discharge Summary   Patient ID:  Eddie Hodge MRN: [id number] DOB/AGE: [birth year] 79 y.o.  Admit date: 08/21/2020 Discharge date: 09/02/2020 Date of Surgery: 08/21/2020 - 08/22/2020 Surgeon: Juliann Mule): Marty Heck, MD  Admission Diagnosis: Lower limb ischemia [I99.8] Ischemia of lower extremity [I99.8]  Discharge Diagnoses:  Lower limb ischemia [I99.8] Ischemia of lower extremity [I99.8]  Secondary Diagnoses: History reviewed. No pertinent past medical history.  Procedure(s): THROMBECTOMY BILATERAL FEMORAL ARTERY and Left iliac thrombectomy. FOUR Compartment FASCIOTOMY  Discharged Condition: stable  HPI: This is a 79 y.o. male with history of tobacco abuse that presents as a transfer from [facility] with acutely ischemic bilateral lower extremities.  Patient states he was in his normal state of health until 3 PM today when he had sudden onset severe pain in both thighs.  He states this started spreading down both legs and he ultimately developed numbness and weakness.  This has since improved in the left leg, but he is having ongoing issues with the right leg.  He had a CTA at [facility] and has been started on heparin.  CTA suggested likely thrombotic/embolic event to right common femoral, right popliteal, and left common and external iliac artery.  He denies any history of previous thromboembolic events.  No lower extremity surgeries.  Chart indicates history of colon cancer although he denies this.  He does have a evidence of a previous small midline laparotomy incision and he is unclear about this surgery.  CTA abdomen pelvis with runoff today shows mural thrombus and plaque throughout the distal descending thoracic aorta and abdominal aorta.  There is thrombus in the right common femoral artery at the SFA profunda bifurcation.  There also appears to be thrombus in the right popliteal artery.  There is minimal reconstitution of a peroneal  artery on the right.  On the left the common and external iliac artery appear occluded.  The left common femoral artery does reconstitute with SFA profunda runoff and no identifiable flow below left knee.   Hospital Course:  RAJOHN Hodge is a 79 y.o. male is S/P  Procedure(s): THROMBECTOMY BILATERAL FEMORAL ARTERY and Left iliac thrombectomy. FOUR Compartment FASCIOTOMY He was managed on Heparin for anticoagulation.  The fasciotomy's with primary closure of skin incisions.   Ace wraps placed to control bloody drainage and edema.  PT/OT consults recommended SNF for rehabilitation.    AKI with elevated CR to 2.0 now decreasing 08/26/20 1.79 with good urine OP.     Patient arterial flow B LE Echo performed without evidence of thrombus Heparin continued for anticoagulation Discharge DOAC for anticoagulation Eliquis Initially was going to be discharged to SNF but developed low grade fever. Chest x-rays were okay. No growth on blood cultures. WBC normal with no shift. No evidence of wound infection. Urinalysis negative. Was started on IV ancef with no clear source of infection. Otherwise remained stable remainder of hospital course. He did have what daughter thought was seizure activity. Has history of seizures in the past and has been off medication. Neurology was consulted. EEG ordered and was normal. Was not felt to have had a seizure and no medications were recommended. They recommend continued seizure precautions such as no driving for 6 months. The seizure precautions are highlighted below. He will follow up as outpatient with neurology in 6-8 weeks.   Stable for discharge home with daughter. PDMP was reviewed and he will have pain medication sent to his listed pharmacy. He will go home  on Eliquis and Crestor, thiamine and folic acid for alcohol abuse DT.  He additionally has been given prescription for 5 days of Keflex. Home DME Rolling walker and bedside commode ordered for patient. He will have  home health services with PT and OT. He has follow up arranged on January 4th with Dr. Carlis Abbott for post op follow up and staple removal.  Consults:  Treatment Team:  Marty Heck, MD  Significant Diagnostic Studies: CBC Lab Results  Component Value Date   WBC 8.6 08/30/2020   HGB 7.1 (L) 08/30/2020   HCT 21.5 (L) 08/30/2020   MCV 101.4 (H) 08/30/2020   PLT 405 (H) 08/30/2020    BMET    Component Value Date/Time   NA 140 08/28/2020 1355   K 3.6 08/28/2020 1355   CL 107 08/28/2020 1355   CO2 23 08/28/2020 1355   GLUCOSE 99 08/28/2020 1355   BUN 22 08/28/2020 1355   CREATININE 1.88 (H) 08/28/2020 1355   CALCIUM 9.2 08/28/2020 1355   GFRNONAA 36 (L) 08/28/2020 1355   GFRAA  03/14/2009 0625    >60        The eGFR has been calculated using the MDRD equation. This calculation has not been validated in all clinical situations. eGFR's persistently <60 mL/min signify possible Chronic Kidney Disease.   COAG Lab Results  Component Value Date   INR 1.1 08/21/2020   INR 0.9 02/12/2009   INR 1.1 12/10/2008     Disposition:  Discharge to :Home Discharge Instructions    Activity as tolerated - No restrictions   Complete by: As directed    Call MD for:  redness, tenderness, or signs of infection (pain, swelling, bleeding, redness, odor or green/yellow discharge around incision site)   Complete by: As directed    Call MD for:  redness, tenderness, or signs of infection (pain, swelling, bleeding, redness, odor or green/yellow discharge around incision site)   Complete by: As directed    Call MD for:  severe or increased pain, loss or decreased feeling  in affected limb(s)   Complete by: As directed    Call MD for:  severe or increased pain, loss or decreased feeling  in affected limb(s)   Complete by: As directed    Call MD for:  temperature >100.5   Complete by: As directed    Call MD for:  temperature >100.5   Complete by: As directed    Discharge instructions    Complete by: As directed    Light ace wraps from toes to knees for edema, elevation of both feet when at rest.  Ambulate as tolerates daily.   Discharge patient   Complete by: As directed    Discharge disposition: 01-Home or Self Care   Discharge patient date: 09/02/2020   Discharge wound care:   Complete by: As directed    Keep lower extremity incisions clean and dry. Wash with mild soap and water and pat dry. Do not apply any lotions or ointments to the incisions. Do not remove staples. They will be removed at time of your post op follow up appointment with Physician   Resume previous diet   Complete by: As directed    Resume previous diet   Complete by: As directed        [place] Per [facility] statutes, patients with seizures are not allowed to drive until they have been seizure-free for six months.   Use caution when using heavy equipment or power tools. Avoid  working on [employer] or at [employer]. Take showers instead of baths. Ensure the water temperature is not too high on the home water heater. Do not go swimming alone. Do not lock yourself in a room alone (i.e. bathroom). When caring for infants or small children, sit down when holding, feeding, or changing them to minimize risk of injury to the child in the event you have a seizure. Maintain good sleep hygiene. Avoid alcohol.   If patienthas another seizure, call 911 and bring them back to the ED if: A. The seizure lasts longer than 5 minutes.  B. The patient doesn't wake shortly after the seizure or has new problems such as difficulty seeing, speaking or moving following the seizure C. The patient was injured during the seizure D. The patient has a temperature over 102 F (39C) E. The patient vomited during the seizure and now is having trouble breathing    Allergies as of 09/02/2020   No Known Allergies     Medication List    TAKE these medications   apixaban 5 MG Tabs tablet Commonly  known as: ELIQUIS Take 1 tablet (5 mg total) by mouth 2 (two) times daily.   cephALEXin 500 MG capsule Commonly known as: Keflex Take 1 capsule (500 mg total) by mouth 4 (four) times daily for 5 days.   Fish Oil 1000 MG Caps Take 1,000 mg by mouth daily.   folic acid 1 MG tablet Commonly known as: FOLVITE Take 1 tablet (1 mg total) by mouth daily.   oxyCODONE-acetaminophen 5-325 MG tablet Commonly known as: PERCOCET/ROXICET Take 1 tablet by mouth every 4 (four) hours as needed for moderate pain.   rosuvastatin 20 MG tablet Commonly known as: CRESTOR Take 1 tablet (20 mg total) by mouth daily.   thiamine 100 MG tablet Take 1 tablet (100 mg total) by mouth daily.            Durable Medical Equipment  (From admission, onward)         Start     Ordered   09/02/20 0920  For home use only DME Bedside commode  Once       Question:  Patient needs a bedside commode to treat with the following condition  Answer:  Peripheral arterial disease ([place])   09/02/20 0923   09/02/20 0919  For home use only DME Walker rolling  Once       Question Answer Comment  Walker: With [place]   Patient needs a walker to treat with the following condition Peripheral arterial disease ([place])      09/02/20 0923           Discharge Care Instructions  (From admission, onward)         Start     Ordered   09/02/20 0000  Discharge wound care:       Comments: Keep lower extremity incisions clean and dry. Wash with mild soap and water and pat dry. Do not apply any lotions or ointments to the incisions. Do not remove staples. They will be removed at time of your post op follow up appointment with Physician   09/02/20 [phone]         Verbal and written Discharge instructions given to the patient. Wound care per Discharge AVS  Follow-up Information    Marty Heck, MD Follow up in 3 week(s).   Specialty: Vascular Surgery Why: office will call for follow up Contact information: [address] [place] [address] [phone]  Dennison Mascot, PA-C Follow up.   Why: 6-8 weeks outpatient follow up Contact information: [address] [place] [address] [phone]               Signed: Karoline Caldwell 09/02/2020, 9:37 AM

## 2020-08-26 NOTE — Progress Notes (Addendum)
[facility] [place]  Subjective  - Doing well with good pain control   Objective 101/60 95 99.4 F (37.4 C) (Oral) 18 100%  Intake/Output Summary (Last 24 hours) at 08/26/2020 0749 Last data filed at 08/26/2020 0606 Gross per 24 hour  Intake 3491.33 ml  Output 800 ml  Net 2691.33 ml    Doppler brisk signals  Dressings removed, compartments soft.  Left foot and lower leg with superficial blisters. Lungs non labored breathing Heart RRR  Assessment/Planning: POD # 4 status post bilateral lower extremity thrombectomies and left iliac thrombectomy from bilateral femoral approachfor acute limb ischemia of bilateral lower extremities.   Patient arterial flow B LE Echo performed without evidence of thrombus Heparin continued for anticoagulation Pending SNF discharge DOAC for anticoagulation Eliquis Incisions re dressed and compression and ace wrap   Roxy Horseman 08/26/2020 7:49 AM --  Laboratory Lab Results: Recent Labs    08/24/20 0158 08/25/20 0141  WBC 13.9* 13.7*  HGB 10.3* 9.1*  HCT 31.2* 27.6*  PLT 111* 145*   BMET Recent Labs    08/23/20 1516 08/26/20 0403  NA 138 136  K 3.8 3.3*  CL 103 103  CO2 24 22  GLUCOSE 106* 177*  BUN 18 15  CREATININE 2.02* 1.79*  CALCIUM 8.8* 8.5*    COAG Lab Results  Component Value Date   INR 1.1 08/21/2020   INR 0.9 02/12/2009   INR 1.1 12/10/2008   No results found for: PTT  I have seen and evaluated the patient. I agree with the PA note as documented above.  79 year old male status post bilateral lower extremity thrombectomies and left iliac thrombectomy for acute ischemia last Wednesday.  He also got fasciotomies that were closed with staples in the OR.  He has very brisk PT signals bilaterally.  All his fasciotomy incisions look good with no significant tension.  Groin incisions also look good.  Awaiting placement giving PT recommend SNF.  Echo with no source of thrombus.  I  suspect this is from his descending thoracic and abdominal aorta.  He has been started on a DOAC which he is tolerating - will continue eliquis.  AKI resolving and Cr improved today to 1.79.  Hgb 9.1.  Marty Heck, MD [facility] [place] Office: [phone]

## 2020-08-27 LAB — SARS CORONAVIRUS 2 BY RT PCR (HOSPITAL ORDER, PERFORMED IN ~~LOC~~ HOSPITAL LAB): SARS Coronavirus 2: NEGATIVE

## 2020-08-27 MED ORDER — ROSUVASTATIN CALCIUM 20 MG PO TABS
20.0000 mg | ORAL_TABLET | Freq: Every day | ORAL | Status: DC
  Administered 2020-08-28 – 2020-09-02 (×6): 20 mg via ORAL
  Filled 2020-08-27 (×6): qty 1

## 2020-08-27 MED ORDER — ROSUVASTATIN CALCIUM 20 MG PO TABS: 20.0000 mg | ORAL_TABLET | Freq: Every day | ORAL | 3 refills | Status: AC

## 2020-08-27 NOTE — Progress Notes (Signed)
Occupational Therapy Treatment Patient Details Name: DEQUINCY BORN MRN: [id number] DOB: [birth year] Today's Date: 08/27/2020    History of present illness Pt is an 79 year old man with PMH of colon cancer and alcohol abuse who was admitted on 08/22/20 with B LE critical ischemia. Underwent B femoral artery thrombectomy, L iliac thrombectomy and 4 compartment fasciotomies B LEs.   OT comments  Pt making progress with functional goals. Pt ambulated to bathroom with RW for toileting tasks and stood at sink for grooming tasks. Pt very pleasant and cooperative. OT will continue to follow acutely to maximize level of function and safety  Follow Up Recommendations  SNF;Supervision/Assistance - 24 hour    Equipment Recommendations  3 in 1 bedside commode;Other (comment) (TBD at next venue of care)    Recommendations for Other Services      Precautions / Restrictions Precautions Precautions: Fall Precaution Comments: watch HR Restrictions Weight Bearing Restrictions: No       Mobility Bed Mobility Overal bed mobility: Needs Assistance Bed Mobility: Supine to Sit     Supine to sit: Supervision        Transfers Overall transfer level: Needs assistance Equipment used: Rolling walker (2 wheeled) Transfers: Sit to/from Stand Sit to Stand: Min assist         General transfer comment: verbal cues for proper hand placement, minA to power up and steady during transition of hands, pt does better when able to push up from arm rests of chair    Balance Overall balance assessment: Needs assistance Sitting-balance support: No upper extremity supported;Feet supported Sitting balance-Leahy Scale: Good     Standing balance support: Bilateral upper extremity supported;During functional activity Standing balance-Leahy Scale: Poor                             ADL either performed or assessed with clinical judgement   ADL Overall ADL's : Needs assistance/impaired      Grooming: Min guard;Standing;Wash/dry hands;Wash/dry face Grooming Details (indicate cue type and reason): standing at sink to wash face and hands Upper Body Bathing: Set up;Sitting Upper Body Bathing Details (indicate cue type and reason): simulated seated EOB Lower Body Bathing: Moderate assistance;Sit to/from stand Lower Body Bathing Details (indicate cue type and reason): simulated Upper Body Dressing : Set up;Sitting Upper Body Dressing Details (indicate cue type and reason): donned clean gown seated EOB Lower Body Dressing: Moderate assistance;Sit to/from stand Lower Body Dressing Details (indicate cue type and reason): socks Toilet Transfer: Ambulation;RW;Minimal assistance   Toileting- Clothing Manipulation and Hygiene: Min guard       Functional mobility during ADLs: Minimal assistance       Vision Baseline Vision/History: Wears glasses Patient Visual Report: No change from baseline     Perception     Praxis      Cognition Arousal/Alertness: Awake/alert Behavior During Therapy: WFL for tasks assessed/performed Overall Cognitive Status: Within Functional Limits for tasks assessed                                 General Comments: pt HOH        Exercises     Shoulder Instructions       General Comments      Pertinent Vitals/ Pain       Pain Assessment: No/denies pain Pain Score: 0-No pain  Home Living  Prior Functioning/Environment              Frequency  Min 2X/week        Progress Toward Goals  OT Goals(current goals can now be found in the care plan section)     Acute Rehab OT Goals Patient Stated Goal: to go to Rehab and then home  Plan      Co-evaluation                 AM-PAC OT "6 Clicks" Daily Activity     Outcome Measure   Help from another person eating meals?: None Help from another person taking care of personal grooming?: A Little Help  from another person toileting, which includes using toliet, bedpan, or urinal?: A Lot Help from another person bathing (including washing, rinsing, drying)?: A Lot Help from another person to put on and taking off regular upper body clothing?: A Little Help from another person to put on and taking off regular lower body clothing?: A Lot 6 Click Score: 16    End of Session Equipment Utilized During Treatment: Gait belt;Rolling walker  OT Visit Diagnosis: Unsteadiness on feet (R26.81);Other abnormalities of gait and mobility (R26.89);Muscle weakness (generalized) (M62.81)   Activity Tolerance     Patient Left in chair;with call bell/phone within reach;with chair alarm set   Nurse Communication Mobility status;Other (comment) (pt up in recliner)        Time: 1106-1130 OT Time Calculation (min): 24 min  Charges: OT General Charges $OT Visit: 1 Visit OT Treatments $Self Care/Home Management : 8-22 mins $Therapeutic Activity: 8-22 mins     Britt Bottom 08/27/2020, 2:05 PM

## 2020-08-27 NOTE — Progress Notes (Addendum)
Progress Note    08/27/2020 7:29 AM 6 Days Post-Op  Subjective:  No complaints   Vitals:   08/26/20 2337 08/27/20 0358  BP: (!) 103/49 (!) 109/54  Pulse: 98 100  Resp: 18 18  Temp: 99.9 F (37.7 C) 99.7 F (37.6 C)  SpO2: 100% 96%    Physical Exam: Cardiac:  RRR Lungs:  Non-labored resp Incisions:  Groin incisions well approximated without drainage Extremities:  Brisk  PT pulses bilaterally. ACE wraps in place over fasciotomy sites   CBC    Component Value Date/Time   WBC 13.7 (H) 08/25/2020 0141   RBC 2.77 (L) 08/25/2020 0141   HGB 9.1 (L) 08/25/2020 0141   HGB 12.9 (L) 03/08/2008 1012   HCT 27.6 (L) 08/25/2020 0141   HCT 39.4 03/08/2008 1012   PLT 145 (L) 08/25/2020 0141   PLT 222 03/08/2008 1012   MCV 99.6 08/25/2020 0141   MCV 97.7 03/08/2008 1012   MCH 32.9 08/25/2020 0141   MCHC 33.0 08/25/2020 0141   RDW 12.7 08/25/2020 0141   RDW 12.4 03/08/2008 1012   LYMPHSABS 0.9 08/21/2020 1821   LYMPHSABS 2.0 03/08/2008 1012   MONOABS 0.8 08/21/2020 1821   MONOABS 0.5 03/08/2008 1012   EOSABS 0.0 08/21/2020 1821   EOSABS 0.2 03/08/2008 1012   BASOSABS 0.0 08/21/2020 1821   BASOSABS 0.1 03/08/2008 1012    BMET    Component Value Date/Time   NA 136 08/26/2020 0403   K 3.3 (L) 08/26/2020 0403   CL 103 08/26/2020 0403   CO2 22 08/26/2020 0403   GLUCOSE 177 (H) 08/26/2020 0403   BUN 15 08/26/2020 0403   CREATININE 1.79 (H) 08/26/2020 0403   CALCIUM 8.5 (L) 08/26/2020 0403   GFRNONAA 38 (L) 08/26/2020 0403   GFRAA  03/14/2009 0625    >60        The eGFR has been calculated using the MDRD equation. This calculation has not been validated in all clinical situations. eGFR's persistently <60 mL/min signify possible Chronic Kidney Disease.     Intake/Output Summary (Last 24 hours) at 08/27/2020 0729 Last data filed at 08/27/2020 9518 Gross per 24 hour  Intake --  Output 900 ml  Net -900 ml    HOSPITAL MEDICATIONS Scheduled Meds: . apixaban   5 mg Oral BID  . docusate sodium  100 mg Oral Daily  . folic acid  1 mg Oral Daily  . multivitamin with minerals  1 tablet Oral Daily  . pantoprazole  40 mg Oral Daily  . rosuvastatin  10 mg Oral Daily  . thiamine  100 mg Oral Daily   Or  . thiamine  100 mg Intravenous Daily   Continuous Infusions: . sodium chloride    . dextrose 5 % and 0.45% NaCl Stopped (08/27/20 0600)  . magnesium sulfate bolus IVPB     PRN Meds:.sodium chloride, acetaminophen **OR** acetaminophen, alum & mag hydroxide-simeth, bisacodyl, guaiFENesin-dextromethorphan, hydrALAZINE, labetalol, LORazepam, magnesium sulfate bolus IVPB, metoprolol tartrate, morphine injection, ondansetron, oxyCODONE-acetaminophen, phenol, polyethylene glycol  Assessment:    79 year old male status post bilateral lower extremity thrombectomies and left iliac thrombectomy for acute ischemia last Wednesday.  He also got fasciotomies that were closed with staples in the OR.  He has very brisk PT signals bilaterally.  Plan: -Continue Eliquis. On statin. Awaiting placement   Risa Grill, PA-C [facility] [phone] 08/27/2020  7:29 AM   I have seen and evaluated the patient. I agree with the PA note as documented above.  79 year old male status post bilateral lower extremity thrombectomies and left iliac thrombectomy for acute ischemia last Wednesday.  He also got fasciotomies that were closed with staples in the OR.  He has very brisk PT signals bilaterally.  AGroin incisions also look good.  Awaiting placement given PT recommend SNF.  Echo with no source of thrombus.  I suspect source is from his descending thoracic and abdominal aorta mural thrombus.  He has been started on a DOAC which he is tolerating and continue eliquis.  OK for discharge to SNF when he has a bed.  Will arrange follow-up 3 weeks for staple removal and wound check when discharged.  Marty Heck, MD [facility]  [place] Office: [phone]

## 2020-08-27 NOTE — Progress Notes (Signed)
PHARMACIST LIPID MONITORING   Eddie Hodge is a 79 y.o. male admitted on 08/21/2020 with PVD.  Pharmacy has been consulted to optimize lipid-lowering therapy with the indication of secondary prevention for clinical ASCVD.  Recent Labs:  Lipid Panel (last 6 months):   Lab Results  Component Value Date   CHOL 172 08/22/2020   TRIG 60 08/22/2020   HDL 64 08/22/2020   CHOLHDL 2.7 08/22/2020   VLDL 12 08/22/2020   LDLCALC 96 08/22/2020    Hepatic function panel (last 6 months):   Lab Results  Component Value Date   AST 30 08/23/2020   ALT 10 08/23/2020   ALKPHOS 51 08/23/2020   BILITOT 1.3 (H) 08/23/2020    SCr (since admission):   Serum creatinine: 1.79 mg/dL (H) 08/26/20 0403 Estimated creatinine clearance: 32.3 mL/min (A)  Current lipid-lowering therapy: Crestor 10mg /d (added this admission) Previous lipid-lowering therapies (if applicable): none noted Documented or reported allergies or intolerances to lipid-lowering therapies (if applicable): none  Assessment:  Patient agrees with changes to lipid-lowering therapy  Recommendation per protocol:  Increase intensity or dose of current statin.  Follow-up with:  Dr. Elita Boone  Follow-up labs after discharge:    Liver function panel and lipid panel in 8-12 weeks then annually  Plan: -Change crestor to 20mg /day  Hildred Laser, PharmD Clinical Pharmacist **Pharmacist phone directory can now be found on [email or web address] ([email or web address]).  Listed under [place].

## 2020-08-27 NOTE — TOC Progression Note (Addendum)
Transition of Care [facility]) - Progression Note    Patient Details  Name: Eddie Hodge MRN: [id number] Date of Birth: Dec 14, 1940  Transition of Care [facility]) CM/SW [place], [place] Phone Number: 08/27/2020, 3:06 PM  Clinical Narrative:     CSW contacted [facility] -they have no beds  CSW visit with patient. CSW introduced self and explained role. Patient states he was agreeable to SNF. Patient gave CSW permission to contact his son, Lennette Bihari to assist with SNF choice.  CSW spoke with Derrill Kay states his SNF choice is [facility]. CSW contacted [facility], they confirmed bed offer.   CSW discussed tobacco and alcohol abuse. Patient expressed he likes to drink and smoke(tobacco) but he is trying to cut down on drinking and smoking. CSW offered resources but patient declined , states he knows what to do. CSW advised he can not smoke or drink alcohol at SNF. Patient states he understands and is agreeable.   [facility] can admit tomorrow - RN updated covid test requested.  Thurmond Butts, MSW, [place] Clinical Social Worker   Expected Discharge Plan: Skilled Nursing Facility Barriers to Discharge: Continued Medical Work up  Expected Discharge Plan and Services Expected Discharge Plan: [place] arrangements for the past 2 months: Single Family Home Expected Discharge Date: 08/27/20                                     Social Determinants of Health (SDOH) Interventions    Readmission Risk Interventions No flowsheet data found.

## 2020-08-28 ENCOUNTER — Inpatient Hospital Stay: Payer: Medicare Other

## 2020-08-28 ENCOUNTER — Inpatient Hospital Stay (HOSPITAL_COMMUNITY): Payer: Medicare Other

## 2020-08-28 LAB — BASIC METABOLIC PANEL
Anion gap: 10 (ref 5–15)
BUN: 22 mg/dL (ref 8–23)
CO2: 23 mmol/L (ref 22–32)
Calcium: 9.2 mg/dL (ref 8.9–10.3)
Chloride: 107 mmol/L (ref 98–111)
Creatinine, Ser: 1.88 mg/dL — ABNORMAL HIGH (ref 0.61–1.24)
GFR, Estimated: 36 mL/min — ABNORMAL LOW (ref >60)
Glucose, Bld: 99 mg/dL (ref 70–99)
Potassium: 3.6 mmol/L (ref 3.5–5.1)
Sodium: 140 mmol/L (ref 135–145)

## 2020-08-28 LAB — CBC
HCT: 22 % — ABNORMAL LOW (ref 39.0–52.0)
Hemoglobin: 7.3 g/dL — ABNORMAL LOW (ref 13.0–17.0)
MCH: 33.5 pg (ref 26.0–34.0)
MCHC: 33.2 g/dL (ref 30.0–36.0)
MCV: 100.9 fL — ABNORMAL HIGH (ref 80.0–100.0)
Platelets: 373 10*3/uL (ref 150–400)
RBC: 2.18 MIL/uL — ABNORMAL LOW (ref 4.22–5.81)
RDW: 13.2 % (ref 11.5–15.5)
WBC: 10 10*3/uL (ref 4.0–10.5)
nRBC: 0 % (ref 0.0–0.2)

## 2020-08-28 NOTE — Progress Notes (Addendum)
Physical Therapy Treatment Patient Details Name: Eddie Hodge MRN: [id number] DOB: [birth year] Today's Date: 08/28/2020    History of Present Illness Pt is an 79 year old man with PMH of colon cancer and alcohol abuse who was admitted on 08/22/20 with B LE critical ischemia. Underwent B femoral artery thrombectomy, L iliac thrombectomy and 4 compartment fasciotomies B LEs.    PT Comments    Pt supine in bed on arrival this session.  Pt pleasant and agreeable to PT session.  In standing and during short bout of gt training he reports tightness in calves.  Once back to bed assisted and instructed patient in B HC/HS stretches.  Issued gt belt this session for continued use.  Continue to recommend SNF placement at this time.     Follow Up Recommendations  SNF;Supervision/Assistance - 24 hour     Equipment Recommendations  Other (comment);Rolling walker with 5" wheels;3in1 (PT)    Recommendations for Other Services       Precautions / Restrictions Precautions Precautions: Fall Precaution Comments: watch HR Restrictions Weight Bearing Restrictions: No    Mobility  Bed Mobility Overal bed mobility: Needs Assistance Bed Mobility: Supine to Sit;Sit to Supine     Supine to sit: Supervision Sit to supine: Supervision   General bed mobility comments: HOB elevated this session able to move to edge of bed and back to bed.  Transfers Overall transfer level: Needs assistance Equipment used: Rolling walker (2 wheeled) Transfers: Sit to/from Stand Sit to Stand: Min assist;From elevated surface         General transfer comment: Cues for hand placement as he was intiially reaching to pull on RW into standing.  Ambulation/Gait Ambulation/Gait assistance: Min assist Gait Distance (Feet): 20 Feet Assistive device: Rolling walker (2 wheeled) Gait Pattern/deviations: Step-through pattern;Decreased stride length;Trunk flexed, decreased dorsiflexion on R and L.   Gait velocity:  variant   General Gait Details: Pt not as heavily reliant on RW for support this session.  He did perform decreased distance due to fatigue.   Stairs             Wheelchair Mobility    Modified Rankin (Stroke Patients Only)       Balance Overall balance assessment: Needs assistance Sitting-balance support: No upper extremity supported;Feet supported Sitting balance-Leahy Scale: Good       Standing balance-Leahy Scale: Poor                              Cognition Arousal/Alertness: Awake/alert Behavior During Therapy: WFL for tasks assessed/performed Overall Cognitive Status: Within Functional Limits for tasks assessed                                 General Comments: pt HOH      Exercises Other Exercises Other Exercises: Performed B HS/HC stretch with gt belt x 3 reps each for 10 sec holds.    General Comments        Pertinent Vitals/Pain Pain Assessment: No/denies pain Pain Score: 0-No pain Pain Location: bilat LEs Pain Descriptors / Indicators: Discomfort    Home Living                      Prior Function            PT Goals (current goals can now be found in the care plan section) Acute  Rehab PT Goals Patient Stated Goal: to go to Rehab and then home PT Goal Formulation: With patient Potential to Achieve Goals: Good Progress towards PT goals: Progressing toward goals    Frequency    Min 3X/week      PT Plan Current plan remains appropriate    Co-evaluation              AM-PAC PT "6 Clicks" Mobility   Outcome Measure  Help needed turning from your back to your side while in a flat bed without using bedrails?: None Help needed moving from lying on your back to sitting on the side of a flat bed without using bedrails?: None Help needed moving to and from a bed to a chair (including a wheelchair)?: A Little Help needed standing up from a chair using your arms (e.g., wheelchair or bedside  chair)?: A Little Help needed to walk in hospital room?: A Lot Help needed climbing 3-5 steps with a railing? : A Little 6 Click Score: 19    End of Session Equipment Utilized During Treatment: Gait belt Activity Tolerance: Patient limited by fatigue Patient left: in chair;with call bell/phone within reach;with chair alarm set Nurse Communication: Mobility status PT Visit Diagnosis: Unsteadiness on feet (R26.81)     Time: 0370-4888 PT Time Calculation (min) (ACUTE ONLY): 12 min  Charges:  $Therapeutic Activity: 8-22 mins                     Erasmo Leventhal , PTA Acute Rehabilitation Services Pager [phone] Office [phone]     Gitel Beste [name] 08/28/2020, 4:50 PM

## 2020-08-28 NOTE — Progress Notes (Addendum)
[facility] [place] noted Tachy cardia, and TM 100.5.  Patient was confused over night.   Objective (!) 111/55 93 100 F (37.8 C) (Oral) 20 98%  Intake/Output Summary (Last 24 hours) at 08/28/2020 0733 Last data filed at 08/28/2020 0420 Gross per 24 hour  Intake 360 ml  Output 240 ml  Net 120 ml    Moving LE's palpable pedal pulses LE ace wrap in place, feet with less edema. Groins soft without hematoma Lungs non labored breathing Heart tachy 100-109 this am  Assessment/Planning:  79 year old male status post bilateral lower extremity thrombectomies and left iliac thrombectomy for acute ischemia  Eliquis daily Pending morning labs, IS at bedside, non labored breathing Will get blood cultures and  chest x ray  Leukocytosis last WBC 13.7 Pending SNF placement Negative COVID test  Roxy Horseman 08/28/2020 7:33 AM --  Laboratory Lab Results: No results for input(s): WBC, HGB, HCT, PLT in the last 72 hours. BMET Recent Labs    08/26/20 0403  NA 136  K 3.3*  CL 103  CO2 22  GLUCOSE 177*  BUN 15  CREATININE 1.79*  CALCIUM 8.5*    COAG Lab Results  Component Value Date   INR 1.1 08/21/2020   INR 0.9 02/12/2009   INR 1.1 12/10/2008   No results found for: PTT  I have seen and evaluated the patient. I agree with the PA note as documented above.  Now postop day 7 status post bilateral lower extremity thrombectomies and left iliac thrombectomy for acute limb ischemia.  Suspect this was atheroembolic from his thoracic and abdominal mural thrombus.  Continue Eliquis which he is tolerating.  Fever 100.5 overnight and apparently more confused.  Seems much more alert this morning and knows he is in the hospital and knows his name and he is waiting for SNF.  Will send updated labs and blood cultures as well as chest x-ray.  I did change his lower extremity dressings and all the fasciotomies are stapled closed and look  fine.  He does have some lower extremity bruising.  Groin incisions look good as well.  Brisk PT Doppler signals bilaterally.  Still awaiting SNF but will probably keep him even if he gets a bed today given new fever.  Marty Heck, MD [facility] [place] Office: [phone]

## 2020-08-28 NOTE — Progress Notes (Signed)
Patient refusing lab draws, VS, and telemetry.  Gerri Lins PA notified.

## 2020-08-28 NOTE — Progress Notes (Addendum)
@  0425 NT ([place]), informed this RN pt was refusing vitals and wanted to be left alone. This RN went to bedside to assess pt. Pt endorsed he was "done with the program" and was leaving tomorrow. Pt was oriented to self and place, but somewhat disoriented to time (didn't realize it was 4 in the morning) and situation (knew he had surgery, but didn't associate that with his current hospitalization). Pt was polite but still declined vital sign assessment. Both of pt's IVs were on the bedside table, and upon inquiry, pt endorsed he had removed them. Pt permitted this RN to assess their entry sites for bleeding (there was none). Pt also permitted RN to assess surgical incisions (unchanged from prior assessment), but refused pulse checks. Pt requested this RN contact his son, but upon being informed of the time, was amenable to contacting him on day shift to prevent from waking him. Bed alarm was set, pt was instructed on use of call bell, and frequent observation will be maintained. Will convey to Day RN, and will page on-call if pt's condition changes.  Update: Phlebotomy endorsed that earlier pt had refused his AM lab draw as well. As with above, will convey to Day RN for Day Team.

## 2020-08-28 NOTE — TOC Progression Note (Signed)
Transition of Care [facility]) - Progression Note    Patient Details  Name: Eddie Hodge MRN: [id number] Date of Birth: 1941/06/12  Transition of Care [facility]) CM/SW [place], [place] Phone Number: 08/28/2020, 11:00 AM  Clinical Narrative:     CSW informed [facility] , patient will not discharge today.   Thurmond Butts, MSW, [place] Clinical Social Worker   Expected Discharge Plan: Skilled Nursing Facility Barriers to Discharge: Continued Medical Work up  Expected Discharge Plan and Services Expected Discharge Plan: [place] arrangements for the past 2 months: Single Family Home Expected Discharge Date: 08/27/20                                     Social Determinants of Health (SDOH) Interventions    Readmission Risk Interventions No flowsheet data found.

## 2020-08-29 LAB — URINALYSIS, ROUTINE W REFLEX MICROSCOPIC
Bacteria, UA: NONE SEEN
Glucose, UA: NEGATIVE mg/dL
Ketones, ur: NEGATIVE mg/dL
Leukocytes,Ua: NEGATIVE
Nitrite: NEGATIVE
Protein, ur: 30 mg/dL — AB
Specific Gravity, Urine: 1.019 (ref 1.005–1.030)
pH: 5 (ref 5.0–8.0)

## 2020-08-29 LAB — CBC WITH DIFFERENTIAL/PLATELET
Abs Immature Granulocytes: 0.4 10*3/uL — ABNORMAL HIGH (ref 0.00–0.07)
Basophils Absolute: 0 10*3/uL (ref 0.0–0.1)
Basophils Relative: 0 %
Eosinophils Absolute: 0.2 10*3/uL (ref 0.0–0.5)
Eosinophils Relative: 2 %
HCT: 22.7 % — ABNORMAL LOW (ref 39.0–52.0)
Hemoglobin: 7.4 g/dL — ABNORMAL LOW (ref 13.0–17.0)
Immature Granulocytes: 4 %
Lymphocytes Relative: 14 %
Lymphs Abs: 1.3 10*3/uL (ref 0.7–4.0)
MCH: 33.2 pg (ref 26.0–34.0)
MCHC: 32.6 g/dL (ref 30.0–36.0)
MCV: 101.8 fL — ABNORMAL HIGH (ref 80.0–100.0)
Monocytes Absolute: 1.2 10*3/uL — ABNORMAL HIGH (ref 0.1–1.0)
Monocytes Relative: 13 %
Neutro Abs: 6.1 10*3/uL (ref 1.7–7.7)
Neutrophils Relative %: 67 %
Platelets: 371 10*3/uL (ref 150–400)
RBC: 2.23 MIL/uL — ABNORMAL LOW (ref 4.22–5.81)
RDW: 13.2 % (ref 11.5–15.5)
WBC: 9.2 10*3/uL (ref 4.0–10.5)
nRBC: 0.2 % (ref 0.0–0.2)

## 2020-08-29 MED ORDER — INFLUENZA VAC A&B SA ADJ QUAD 0.5 ML IM PRSY
0.5000 mL | PREFILLED_SYRINGE | INTRAMUSCULAR | Status: DC
  Filled 2020-08-29: qty 0.5

## 2020-08-29 NOTE — Progress Notes (Addendum)
  Progress Note    08/29/2020 7:53 AM 8 Days Post-Op  Subjective:  No complaints; low grade fever this morning.    Vitals:   08/28/20 1941 08/29/20 0513  BP: 125/64 (!) 120/52  Pulse: 92 (!) 102  Resp: 20 18  Temp: 98.8 F (37.1 C) (!) 100.4 F (38 C)  SpO2: 95% 96%   Physical Exam: Lungs:  Non labored Incisions:  L lateral  Extremities:  Palpable R DP pulse; brisk L DP and peroneal by doppler; blistering L foot and lower leg; L lateral fasciotomy skin edges darkening Neurologic: A&O  CBC    Component Value Date/Time   WBC 9.2 08/29/2020 0230   RBC 2.23 (L) 08/29/2020 0230   HGB 7.4 (L) 08/29/2020 0230   HGB 12.9 (L) 03/08/2008 1012   HCT 22.7 (L) 08/29/2020 0230   HCT 39.4 03/08/2008 1012   PLT 371 08/29/2020 0230   PLT 222 03/08/2008 1012   MCV 101.8 (H) 08/29/2020 0230   MCV 97.7 03/08/2008 1012   MCH 33.2 08/29/2020 0230   MCHC 32.6 08/29/2020 0230   RDW 13.2 08/29/2020 0230   RDW 12.4 03/08/2008 1012   LYMPHSABS 1.3 08/29/2020 0230   LYMPHSABS 2.0 03/08/2008 1012   MONOABS 1.2 (H) 08/29/2020 0230   MONOABS 0.5 03/08/2008 1012   EOSABS 0.2 08/29/2020 0230   EOSABS 0.2 03/08/2008 1012   BASOSABS 0.0 08/29/2020 0230   BASOSABS 0.1 03/08/2008 1012    BMET    Component Value Date/Time   NA 140 08/28/2020 1355   K 3.6 08/28/2020 1355   CL 107 08/28/2020 1355   CO2 23 08/28/2020 1355   GLUCOSE 99 08/28/2020 1355   BUN 22 08/28/2020 1355   CREATININE 1.88 (H) 08/28/2020 1355   CALCIUM 9.2 08/28/2020 1355   GFRNONAA 36 (L) 08/28/2020 1355   GFRAA  03/14/2009 0625    >60        The eGFR has been calculated using the MDRD equation. This calculation has not been validated in all clinical situations. eGFR's persistently <60 mL/min signify possible Chronic Kidney Disease.    INR    Component Value Date/Time   INR 1.1 08/21/2020 1821    No intake or output data in the 24 hours ending 08/29/20 0753   Assessment/Plan:  79 y.o. male is s/p BLE  thrombectomy and fasciotomy 8 Days Post-Op   Feet well perfused Low grade fever again today; CXR negative; WBC wnl; blood cultures pending OOB SNF when medically ready   Dagoberto Ligas, PA-C [facility] [phone] 08/29/2020 7:53 AM   I have seen and evaluated the patient. I agree with the PA note as documented above. Now postop day 8 status post bilateral lower extremity thrombectomies and left iliac thrombectomy for acute limb ischemia.  Suspect this was atheroembolic from his thoracic and abdominal mural thrombus.  Continue Eliquis which he is tolerating.  Had another low grade fever 100.4.  CXR ok yesterday.  No growth on blood cultures.  WBC normal with no left shift.  Groin incisions look good as well.  Brisk PT Doppler signals bilaterally and right DP palpable.  Still awaiting SNF but will probably keep him - no clear source at this time.  Some darkening of left lateral fasciotomy skin edges where closed with staples - but no cellulitis.  Marty Heck, MD [facility] [place] Office: [phone]

## 2020-08-30 ENCOUNTER — Ambulatory Visit: Payer: Medicare Other

## 2020-08-30 LAB — CBC
HCT: 21.5 % — ABNORMAL LOW (ref 39.0–52.0)
Hemoglobin: 7.1 g/dL — ABNORMAL LOW (ref 13.0–17.0)
MCH: 33.5 pg (ref 26.0–34.0)
MCHC: 33 g/dL (ref 30.0–36.0)
MCV: 101.4 fL — ABNORMAL HIGH (ref 80.0–100.0)
Platelets: 405 10*3/uL — ABNORMAL HIGH (ref 150–400)
RBC: 2.12 MIL/uL — ABNORMAL LOW (ref 4.22–5.81)
RDW: 13.3 % (ref 11.5–15.5)
WBC: 8.6 10*3/uL (ref 4.0–10.5)
nRBC: 0.2 % (ref 0.0–0.2)

## 2020-08-30 MED ORDER — CEFAZOLIN SODIUM-DEXTROSE 1-4 GM/50ML-% IV SOLN
1.0000 g | Freq: Three times a day (TID) | INTRAVENOUS | Status: DC
  Administered 2020-08-30 – 2020-09-02 (×10): 1 g via INTRAVENOUS
  Filled 2020-08-30 (×13): qty 50

## 2020-08-30 NOTE — Progress Notes (Signed)
[facility] [place]  Subjective  - no complaints.   Objective 116/62 88 98.6 F (37 C) (Oral) 20 100%  Intake/Output Summary (Last 24 hours) at 08/30/2020 1104 Last data filed at 08/30/2020 0521 Gross per 24 hour  Intake 120 ml  Output 775 ml  Net -655 ml    Lungs:  Non labored Extremities:  Brisk PT signals bilaterally; L lateral fasciotomy skin edges darkening Neurologic: A&O  Laboratory Lab Results: Recent Labs    08/29/20 0230 08/30/20 0144  WBC 9.2 8.6  HGB 7.4* 7.1*  HCT 22.7* 21.5*  PLT 371 405*   BMET Recent Labs    08/28/20 1355  NA 140  K 3.6  CL 107  CO2 23  GLUCOSE 99  BUN 22  CREATININE 1.88*  CALCIUM 9.2    COAG Lab Results  Component Value Date   INR 1.1 08/21/2020   INR 0.9 02/12/2009   INR 1.1 12/10/2008   No results found for: PTT  Assessment/Planning:  Now postop day 9 status post bilateral lower extremity thrombectomies and left iliac thrombectomy for acute limb ischemia. Suspect this was atheroembolic from his thoracic and abdominal mural thrombus. Continue Eliquis which he is tolerating. Ongoing low grade temps.  CXR ok yesterday.  No growth on blood cultures.  WBC normal with no left shift.  Groin incisions look good as well. Brisk PT Doppler signals bilaterally.  Still awaiting SNF.  Some darkening of left lateral fasciotomy skin edges where closed with staples - but no cellulitis or drainage - will start IV ancef but not clear this is the source.   Marty Heck 08/30/2020 11:04 AM --

## 2020-08-30 NOTE — Progress Notes (Signed)
Notified Dr. Oneida Arenas regarding daughters concern about seeing a MD today. Dr. Oneida Arenas is aware and plan is to talk to Dr. Carlis Abbott.

## 2020-08-30 NOTE — Progress Notes (Signed)
Dagoberto Ligas PA notified of pt and condition. Will continue to monitor.

## 2020-08-30 NOTE — Progress Notes (Signed)
   Covid-19 Vaccination Clinic  Name:  Eddie Hodge    MRN: [id number] DOB: 09/11/1941  08/30/2020  Eddie Hodge was observed post Covid-19 immunization for 15 minutes without incident. He was provided with Vaccine Information Sheet and instruction to access the V-Safe system.   Eddie Hodge was instructed to call 911 with any severe reactions post vaccine: Marland Kitchen Difficulty breathing  . Swelling of face and throat  . A fast heartbeat  . A bad rash all over body  . Dizziness and weakness

## 2020-08-30 NOTE — Progress Notes (Signed)
Yell out for help came from hallway. This RN at computer by pyxis [id number]. Call light for 4E08 lite up. This RN ambulated to room where PT was with pt who was at the sink standing with the assistance of a walker. Another RN at pt side. RRT was notified of pt. Pt helped to chair. Seemed to be "zoned out", starring off not fully alert. Pt noted to be rigid in chair with arm stiff, starring to right with eye devaited to the right. With the help of the RN pt placed in bed, hooked up to the monitor. V/S stable see flowsheet. Pt fully alert and back at baseline. Dr. Carlis Abbott notified.

## 2020-08-30 NOTE — Progress Notes (Signed)
Physical Therapy Treatment Patient Details Name: Eddie Hodge MRN: [id number] DOB: [birth year] Today's Date: 08/30/2020    History of Present Illness Pt is an 79 year old man with PMH of colon cancer and alcohol abuse who was admitted on 08/22/20 with B LE critical ischemia. Underwent B femoral artery thrombectomy, L iliac thrombectomy and 4 compartment fasciotomies B LEs.    PT Comments    Pt supine in bed on arrival this session.  Pt performed LE exercises and HC/HS stretches to improve ROM.  Pt performed gt training to bathroom and out of bathroom to sink.  While standing at sink.  Presents with LOB, slumped forward with B knees buckling, verbally non responsive and eyes rolling to back of head.  RN into room to assist PTA to get patient safely to a chair.  Pt then verbally responsive in chair.  Assisted patient back to bed to obtain vitals.  Pt alert and oriented once back in bed.  Continue to recommend SNF placement.    Follow Up Recommendations  SNF;Supervision/Assistance - 24 hour     Equipment Recommendations  Other (comment);Rolling walker with 5" wheels;3in1 (PT)    Recommendations for Other Services       Precautions / Restrictions Precautions Precautions: Fall Precaution Comments: watch HR, seizure like activity on 08/30/20 Restrictions Weight Bearing Restrictions: No    Mobility  Bed Mobility Overal bed mobility: Needs Assistance Bed Mobility: Supine to Sit     Supine to sit: Supervision Sit to supine: Total assist   General bed mobility comments: Pt able to move to edge of bed unassisted.  He had a syncopal/seizure like episode and required total A to move back to bed.  Transfers Overall transfer level: Needs assistance Equipment used: Rolling walker (2 wheeled) Transfers: Sit to/from Stand Sit to Stand: Min guard;Total assist         General transfer comment: Min guard for safety, total assistance to sit in chair after syncopal/seizure?  episode.  Ambulation/Gait Ambulation/Gait assistance: Min guard Gait Distance (Feet): 10 Feet (+ 5 ft.  Unable to progress further due to syncopal/seizure episode?) Assistive device: Rolling walker (2 wheeled) Gait Pattern/deviations: Step-through pattern;Decreased stride length;Trunk flexed Gait velocity: variant   General Gait Details: Pt moving much better initially this session.  On way back from bathroom he was at sink washing hands.  When drying hands he started to lean forward, non responsive verbally, B knee buckling and eyes rolling back.  Called for help and assisted patient to a chair.  Pt then moved back to bed and vitals assessed.  116/92 HR 82 bpm and SPO2 100% on RA.  Nurse at bed side applied telemetry.   Stairs             Wheelchair Mobility    Modified Rankin (Stroke Patients Only)       Balance Overall balance assessment: Needs assistance Sitting-balance support: No upper extremity supported;Feet supported Sitting balance-Leahy Scale: Good       Standing balance-Leahy Scale: Zero Standing balance comment: Poor to zero when episode occured.                            Cognition Arousal/Alertness: Awake/alert Behavior During Therapy: WFL for tasks assessed/performed Overall Cognitive Status: Within Functional Limits for tasks assessed  General Comments: pt HOH      Exercises General Exercises - Lower Extremity Ankle Circles/Pumps: AROM;Both;15 reps;Supine Quad Sets: AROM;Both;10 reps;Supine Heel Slides: AROM;Both;10 reps;Supine Hip ABduction/ADduction: AROM;Both;10 reps;Supine Straight Leg Raises: AROM;Both;10 reps;Supine Other Exercises Other Exercises: Performed B HS/HC stretch with gt belt x 3 reps each for 15 sec holds.    General Comments        Pertinent Vitals/Pain Pain Assessment: No/denies pain Pain Location: bilat LEs Pain Descriptors / Indicators: Discomfort    Home  Living                      Prior Function            PT Goals (current goals can now be found in the care plan section) Acute Rehab PT Goals Patient Stated Goal: to go to Rehab and then home Potential to Achieve Goals: Good Progress towards PT goals: Progressing toward goals    Frequency    Min 3X/week      PT Plan Current plan remains appropriate    Co-evaluation              AM-PAC PT "6 Clicks" Mobility   Outcome Measure  Help needed turning from your back to your side while in a flat bed without using bedrails?: Total Help needed moving from lying on your back to sitting on the side of a flat bed without using bedrails?: Total Help needed moving to and from a bed to a chair (including a wheelchair)?: Total Help needed standing up from a chair using your arms (e.g., wheelchair or bedside chair)?: Total Help needed to walk in hospital room?: Total Help needed climbing 3-5 steps with a railing? : Total 6 Click Score: 6    End of Session Equipment Utilized During Treatment: Gait belt Activity Tolerance: Treatment limited secondary to medical complications (Comment) (Pt had syncopal episode at sync with ? seizure like activity.  RN quick to come to room and assist patient to chair and then back to bed.  Vital taken and staff RN at bedside.) Patient left: in bed;with call bell/phone within reach;with nursing/sitter in room Nurse Communication: Mobility status PT Visit Diagnosis: Unsteadiness on feet (R26.81)     Time: 2080-2233 PT Time Calculation (min) (ACUTE ONLY): 23 min  Charges:  $Therapeutic Exercise: 8-22 mins $Therapeutic Activity: 8-22 mins                     Erasmo Leventhal , PTA Acute Rehabilitation Services Pager [phone] Office [phone]     Eddie Hodge 08/30/2020, 12:18 PM

## 2020-08-30 NOTE — Progress Notes (Signed)
Occupational Therapy Treatment Patient Details Name: Eddie Hodge MRN: [id number] DOB: Sep 26, 1941 Today's Date: 08/30/2020    History of present illness Pt is an 79 year old man with PMH of colon cancer and alcohol abuse who was admitted on 08/22/20 with B LE critical ischemia. Underwent B femoral artery thrombectomy, L iliac thrombectomy and 4 compartment fasciotomies B LEs.   OT comments  Patient found lying in bed.  Requesting bathroom usage.  BP taken supine: 104/52  In sit 117/99 and HR 111.  Patient able to stand, no complaint of dizziness, mobilized to bathroom with Min Guard and 2WRW.  Transfers with supervision and hygiene with supervision.  Patient was able to remember his syncopal episode with PT this morning, not sure what happened.  SNF has been recommended and OT will continue to follow in the acute setting.  Deficits listed below which are impacting his functional status and independence.    Follow Up Recommendations  SNF;Supervision/Assistance - 24 hour    Equipment Recommendations  3 in 1 bedside commode;Other (comment)    Recommendations for Other Services      Precautions / Restrictions Precautions Precautions: Fall Precaution Comments: watch HR, seizure like activity on 08/30/20 Restrictions Weight Bearing Restrictions: No       Mobility Bed Mobility Overal bed mobility: Needs Assistance Bed Mobility: Supine to Sit;Sit to Supine     Supine to sit: Supervision Sit to supine: Supervision   General bed mobility comments: Pt able to move to edge of bed unassisted.  He had a syncopal/seizure like episode and required total A to move back to bed.  Transfers Overall transfer level: Needs assistance Equipment used: Rolling walker (2 wheeled) Transfers: Sit to/from Stand Sit to Stand: Min guard         General transfer comment: Min guard for safety, total assistance to sit in chair after syncopal/seizure? episode.    Balance Overall balance assessment:  Needs assistance Sitting-balance support: No upper extremity supported;Feet supported Sitting balance-Leahy Scale: Good     Standing balance support: Bilateral upper extremity supported;During functional activity Standing balance-Leahy Scale: Poor Standing balance comment: Poor to zero when episode occured.                           ADL either performed or assessed with clinical judgement   ADL                           Toilet Transfer: Ambulation;RW;Minimal assistance   Toileting- Clothing Manipulation and Hygiene: Supervision/safety;Sit to/from stand       Functional mobility during ADLs: Passenger transport manager     Praxis      Cognition Arousal/Alertness: Awake/alert Behavior During Therapy: WFL for tasks assessed/performed Overall Cognitive Status: Within Functional Limits for tasks assessed                                 General Comments: pt HOH        Exercises    Shoulder Instructions       General Comments      Pertinent Vitals/ Pain       Pain Assessment: No/denies pain Pain Location: bilat LEs Pain Descriptors / Indicators: Discomfort  Frequency  Min 2X/week        Progress Toward Goals  OT Goals(current goals can now be found in the care plan section)  Progress towards OT goals: Progressing toward goals  Acute Rehab OT Goals Patient Stated Goal: I'd like to go home OT Goal Formulation: With patient Time For Goal Achievement: 09/06/20 Potential to Achieve Goals: Good  Plan Discharge plan remains appropriate    Co-evaluation                 AM-PAC OT "6 Clicks" Daily Activity     Outcome Measure   Help from another person eating meals?: None Help from another person taking care of personal grooming?: A Little Help from another person toileting, which includes using  toliet, bedpan, or urinal?: A Little Help from another person bathing (including washing, rinsing, drying)?: A Lot Help from another person to put on and taking off regular upper body clothing?: A Little Help from another person to put on and taking off regular lower body clothing?: A Lot 6 Click Score: 17    End of Session Equipment Utilized During Treatment: Gait belt;Rolling walker  OT Visit Diagnosis: Unsteadiness on feet (R26.81);Other abnormalities of gait and mobility (R26.89);Muscle weakness (generalized) (M62.81)   Activity Tolerance Patient tolerated treatment well   Patient Left in bed;with call bell/phone within reach;with bed alarm set   Nurse Communication Mobility status        Time: 4835-0757 OT Time Calculation (min): 10 min  Charges: OT General Charges $OT Visit: 1 Visit OT Treatments $Self Care/Home Management : 8-22 mins  08/30/2020  Eddie Hodge, OTR/L  Acute Rehabilitation Services  Office:  [phone]    Metta Clines 08/30/2020, 3:25 PM

## 2020-08-31 ENCOUNTER — Inpatient Hospital Stay (HOSPITAL_COMMUNITY): Payer: Medicare Other

## 2020-08-31 DIAGNOSIS — R569 Unspecified convulsions: Secondary | ICD-10-CM

## 2020-08-31 LAB — FOLATE: Folate: 30.3 ng/mL (ref >5.9)

## 2020-08-31 LAB — VITAMIN B12: Vitamin B-12: 397 pg/mL (ref 180–914)

## 2020-08-31 NOTE — Progress Notes (Addendum)
Progress Note    08/31/2020 8:27 AM 10 Days Post-Op  Subjective: He denies pain, cough, chills.  He states he was given something for constipation and has been up out of bed to the bathroom for bowel movements several times this morning.  He is tolerating his diet.   Vitals:   08/31/20 0700 08/31/20 0804  BP:  112/60  Pulse:  89  Resp: (!) 27 20  Temp:  98.7 F (37.1 C)  SpO2:  98%    Physical Exam: Cardiac: Rate and rhythm are regular Lungs: Lungs are clear to auscultation bilaterally Incisions: Right groin incisions are well approximated without bleeding or drainage.  He has edema of the right groin consistent with a small nonexpanding hematoma. Extremities: He has active range of motion of both feet.  He has decreased sensation in the dorsum of the left foot as compared to the right.  Both feet are warm.  Bullous lesion to dorsum of left foot.  Fasciotomy incision lines remain well approximated with skin darkening of bilateral lateral incisions. No drainage. Bilateral lower leg edema L > R He has triphasic right DP and PT Doppler signals He has biphasic left DP and PT signals. + peroneal signals bilaterally  Left lateral in foreground  Right lateral     CBC    Component Value Date/Time   WBC 8.6 08/30/2020 0144   RBC 2.12 (L) 08/30/2020 0144   HGB 7.1 (L) 08/30/2020 0144   HGB 12.9 (L) 03/08/2008 1012   HCT 21.5 (L) 08/30/2020 0144   HCT 39.4 03/08/2008 1012   PLT 405 (H) 08/30/2020 0144   PLT 222 03/08/2008 1012   MCV 101.4 (H) 08/30/2020 0144   MCV 97.7 03/08/2008 1012   MCH 33.5 08/30/2020 0144   MCHC 33.0 08/30/2020 0144   RDW 13.3 08/30/2020 0144   RDW 12.4 03/08/2008 1012   LYMPHSABS 1.3 08/29/2020 0230   LYMPHSABS 2.0 03/08/2008 1012   MONOABS 1.2 (H) 08/29/2020 0230   MONOABS 0.5 03/08/2008 1012   EOSABS 0.2 08/29/2020 0230   EOSABS 0.2 03/08/2008 1012   BASOSABS 0.0 08/29/2020 0230   BASOSABS 0.1 03/08/2008 1012    BMET    Component Value  Date/Time   NA 140 08/28/2020 1355   K 3.6 08/28/2020 1355   CL 107 08/28/2020 1355   CO2 23 08/28/2020 1355   GLUCOSE 99 08/28/2020 1355   BUN 22 08/28/2020 1355   CREATININE 1.88 (H) 08/28/2020 1355   CALCIUM 9.2 08/28/2020 1355   GFRNONAA 36 (L) 08/28/2020 1355   GFRAA  03/14/2009 0625    >60        The eGFR has been calculated using the MDRD equation. This calculation has not been validated in all clinical situations. eGFR's persistently <60 mL/min signify possible Chronic Kidney Disease.     Intake/Output Summary (Last 24 hours) at 08/31/2020 0827 Last data filed at 08/30/2020 2100 Gross per 24 hour  Intake 409.93 ml  Output 350 ml  Net 59.93 ml    HOSPITAL MEDICATIONS Scheduled Meds: . apixaban  5 mg Oral BID  . docusate sodium  100 mg Oral Daily  . folic acid  1 mg Oral Daily  . influenza vaccine adjuvanted  0.5 mL Intramuscular Tomorrow-1000  . multivitamin with minerals  1 tablet Oral Daily  . pantoprazole  40 mg Oral Daily  . rosuvastatin  20 mg Oral Daily  . thiamine  100 mg Oral Daily   Or  . thiamine  100 mg Intravenous  Daily   Continuous Infusions: . sodium chloride    .  ceFAZolin (ANCEF) IV 1 g (08/31/20 0629)  . dextrose 5 % and 0.45% NaCl Stopped (08/27/20 0600)  . magnesium sulfate bolus IVPB     PRN Meds:.sodium chloride, acetaminophen **OR** acetaminophen, alum & mag hydroxide-simeth, bisacodyl, guaiFENesin-dextromethorphan, hydrALAZINE, labetalol, LORazepam, magnesium sulfate bolus IVPB, metoprolol tartrate, ondansetron, phenol, polyethylene glycol  Assessment:POD 10: 79 yo s/p right lower extremity thrombectomy of common femoral, profunda, SFA, popliteal and tibial arteries from right femoral approach Left iliac artery thrombectomy from left femoral approach Left lower extremity thrombectomy of common femoral, profunda, SFA from left femoral approach. Both LE extremities will perfused.  Bilateral lower extremity 4 compartment  fasciotomies>> Likely some element of ischemia of lateral fasciotomy superficial skin edges bilaterally.  POD 10: Afebrile now times approximately 18 hours. His WBC remains on downward trend.   Plan: -Continue current care plan including antibiotics for another 24 hours. -DVT prophylaxis: on apixaban -Patient's daughter reports patient has a history of seizures in the past and has been off medication.  She reports new seizure activity and requests neurology consultation.  Risa Grill, PA-C [facility] [phone] 08/31/2020  8:27 AM   I agree with the above.  I have seen and evaluated the patient.  He has a blister on his left foot with fluid in it without cellulitis.  He continues to have skin necrosis on the lateral left leg fasciotomy.  He is afebrile with a normal white count.  I appreciate neurology assistance with seizure work-up.  Patient's daughter was at the bedside today.  Annamarie Major
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
## 2020-08-31 NOTE — Progress Notes (Signed)
Sent text page to neurology MD on call, Dr. Lyn Records,, to review pt MRI result per phone call from radiology.

## 2020-09-01 ENCOUNTER — Inpatient Hospital Stay (HOSPITAL_COMMUNITY): Payer: Medicare Other

## 2020-09-01 DIAGNOSIS — R569 Unspecified convulsions: Secondary | ICD-10-CM | POA: Diagnosis not present

## 2020-09-01 LAB — HEMOGLOBIN A1C
Hgb A1c MFr Bld: 5.2 % (ref 4.8–5.6)
Mean Plasma Glucose: 102.54 mg/dL

## 2020-09-01 LAB — TSH: TSH: 2.976 u[IU]/mL (ref 0.350–4.500)

## 2020-09-01 NOTE — Progress Notes (Addendum)
Progress Note    09/01/2020 7:44 AM 11 Days Post-Op  Subjective:  Sleeping; awakens easily without complaints this morning. Dr. Rory Percy and attendant RN at bedside.   Vitals:   09/01/20 0005 09/01/20 0510  BP: (!) 108/59 (!) 100/53  Pulse: 89 89  Resp: 16 18  Temp: 98.8 F (37.1 C) 99 F (37.2 C)  SpO2: 100% 97%    Physical Exam: Cardiac: Rate and rhythm are regular Lungs: Lungs are clear to auscultation bilaterally Incisions: Right groin incisions are well approximated without bleeding or drainage.  He has edema of the right groin. Firm and non-expanding. Extremities: He has active range of motion of both feet.  He has decreased sensation in the dorsum of the left foot as compared to the right.  Both feet are warm.  Intact bullous lesion to dorsum of left foot.   Fasciotomy incision lines remain well approximated with skin darkening of bilateral lateral incisions. No drainage. Bilateral lower leg edema L > R He has brisk right DP and left DP Doppler signals. CBC    Component Value Date/Time   WBC 8.6 08/30/2020 0144   RBC 2.12 (L) 08/30/2020 0144   HGB 7.1 (L) 08/30/2020 0144   HGB 12.9 (L) 03/08/2008 1012   HCT 21.5 (L) 08/30/2020 0144   HCT 39.4 03/08/2008 1012   PLT 405 (H) 08/30/2020 0144   PLT 222 03/08/2008 1012   MCV 101.4 (H) 08/30/2020 0144   MCV 97.7 03/08/2008 1012   MCH 33.5 08/30/2020 0144   MCHC 33.0 08/30/2020 0144   RDW 13.3 08/30/2020 0144   RDW 12.4 03/08/2008 1012   LYMPHSABS 1.3 08/29/2020 0230   LYMPHSABS 2.0 03/08/2008 1012   MONOABS 1.2 (H) 08/29/2020 0230   MONOABS 0.5 03/08/2008 1012   EOSABS 0.2 08/29/2020 0230   EOSABS 0.2 03/08/2008 1012   BASOSABS 0.0 08/29/2020 0230   BASOSABS 0.1 03/08/2008 1012    BMET    Component Value Date/Time   NA 140 08/28/2020 1355   K 3.6 08/28/2020 1355   CL 107 08/28/2020 1355   CO2 23 08/28/2020 1355   GLUCOSE 99 08/28/2020 1355   BUN 22 08/28/2020 1355   CREATININE 1.88 (H) 08/28/2020 1355    CALCIUM 9.2 08/28/2020 1355   GFRNONAA 36 (L) 08/28/2020 1355   GFRAA  03/14/2009 0625    >60        The eGFR has been calculated using the MDRD equation. This calculation has not been validated in all clinical situations. eGFR's persistently <60 mL/min signify possible Chronic Kidney Disease.     Intake/Output Summary (Last 24 hours) at 09/01/2020 0744 Last data filed at 09/01/2020 0511 Gross per 24 hour  Intake 150 ml  Output 500 ml  Net -350 ml    HOSPITAL MEDICATIONS Scheduled Meds: . apixaban  5 mg Oral BID  . docusate sodium  100 mg Oral Daily  . folic acid  1 mg Oral Daily  . influenza vaccine adjuvanted  0.5 mL Intramuscular Tomorrow-1000  . multivitamin with minerals  1 tablet Oral Daily  . pantoprazole  40 mg Oral Daily  . rosuvastatin  20 mg Oral Daily  . thiamine  100 mg Oral Daily   Or  . thiamine  100 mg Intravenous Daily   Continuous Infusions: . sodium chloride    .  ceFAZolin (ANCEF) IV 1 g (09/01/20 0532)  . dextrose 5 % and 0.45% NaCl Stopped (08/27/20 0600)  . magnesium sulfate bolus IVPB     PRN  Meds:.sodium chloride, acetaminophen **OR** acetaminophen, alum & mag hydroxide-simeth, bisacodyl, guaiFENesin-dextromethorphan, hydrALAZINE, labetalol, LORazepam, magnesium sulfate bolus IVPB, metoprolol tartrate, ondansetron, phenol, polyethylene glycol  Assessment: POD 11: 79 yo s/p right lower extremity thrombectomy ofcommon femoral, profunda, SFA, popliteal and tibialarteriesfromrightfemoral approach Left iliac arterythrombectomy from left femoral approach Left lower extremity thrombectomy ofcommon femoral, profunda, SFAfrom left femoral approach. Both LE extremities will perfused. Right groin hematoma vs. seroma vs. lympocele  VSS. Tmax 99.2 yesterday evening.     Bilateral lower extremity 4 compartment fasciotomies>> Likely some element of ischemia of lateral fasciotomy superficial skin edges bilaterally.  History of  seizures/TBI>>neurology consult and recs appreciated  Plan:  -Neuro work-up underway. EEG today. (It is imperative in our dc summary that we include the seizure precautions as outlined in Dr. Johny Chess note yesterday.) -DVT prophylaxis:  On apixiban   Risa Grill, PA-C [facility] [phone] 09/01/2020  7:44 AM   I agree with the above.  I have seen and evaluated the patient.  He is stable from a vascular perspective.  He is undergoing neurology work-up for seizures.  I appreciate their assistance.  Annamarie Major

## 2020-09-01 NOTE — Progress Notes (Signed)
EEG complete - results pending 

## 2020-09-01 NOTE — Progress Notes (Addendum)
Neurology Progress Note  Patient ID: Eddie Hodge is a 79 y.o. 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  He underwent bilateral femoral thrombectomy, left iliac thrombectomy and 4 compartment fasciotomies on day of admission.  During this hospitalization he sustained a seizure on 08/30/20 where he became rigid, staring out into space with a right gaze deviation. CT head showed no acute intracranial abnormalities, MRI brain shows chronic subdural/subarachnoid hematoma as well as chronic right cerebellar infarct.EEG is pending. He is off AED.  Significant ETOH use with 2 pints per day of E&J brandy. Active 1ppd smoking.   Initially consulted for: seizure activity   Major interval events:  08/30/20: seizure activity during hospitalization.  Subjective: No complaints.   Exam: Vitals:   09/01/20 0005 09/01/20 0510  BP: (!) 108/59 (!) 100/53  Pulse: 89 89  Resp: 16 18  Temp: 98.8 F (37.1 C) 99 F (37.2 C)  SpO2: 100% 97%   Gen: In bed, comfortable  Resp: non-labored breathing, no grossly audible wheezing Abd: soft, nt Neuro:  Mental Status: Patient is awake, alert, oriented to person, place, month, year, and situation. He occasionally becomes transiently confused but is easily reoriented.  Patient is unable to give a clear and coherent history. Most of history obtained from daughter at bedside. No signs of aphasia or neglect  Cranial Nerves: II: Visual Fields are full. Pupils are equal, round, and reactive to light.    III,IV, VI: EOMI without ptosis or diploplia.  V: Facial sensation is symmetric to temperature VII: Facial movement is symmetric.  VIII: hearing is intact to  voice X: Uvula elevates symmetrically XI: Shoulder shrug is symmetric. XII: tongue is midline without atrophy or fasciculations.  Motor: Tone is normal. Bulk is normal. 5/5 strength was present in upper extremities. Bilateral lower extremities are equally weak from recent vascular procedures.  Sensory: Sensation is symmetric to light touch and temperature in the arms and legs.  Deep Tendon Reflexes: 2+ and symmetric in the biceps. Lower extremity reflex not done.  Plantars: Toes are downgoing bilaterally.   Cerebellar: FNF and HKS are intact bilaterally      Pertinent Labs:  Results for ALTAIR, APPENZELLER (MRN [id number]) as of 09/01/2020 09:38  Ref. Range 08/31/2020 17:08  Folate Latest Ref Range: >5.9 ng/mL 30.3  Vitamin B12 Latest Ref Range: 180 - 914 pg/mL 397  Results for KAYA, KLAUSING (MRN [id number]) as of 09/01/2020 09:38  Ref. Range 09/01/2020 02:19  Hemoglobin A1C Latest Ref Range: 4.8 - 5.6 % 5.2  TSH Latest Ref Range: 0.350 - 4.500 uIU/mL 2.976  Results for THANE, AGE (MRN [id number]) as of 09/01/2020 09:38  Ref. Range 08/21/2020 18:21  Alcohol, Ethyl (B) Latest Ref Range: <10 mg/dL <10  Amphetamines Latest Ref Range: NONE DETECTED  NONE DETECTED  Barbiturates Latest Ref Range: NONE DETECTED  NONE DETECTED  Benzodiazepines Latest Ref Range: NONE DETECTED  NONE DETECTED  Opiates Latest Ref Range: NONE DETECTED  NONE DETECTED  COCAINE Latest Ref Range: NONE DETECTED  NONE DETECTED  Tetrahydrocannabinol Latest Ref Range: NONE DETECTED  NONE DETECTED   MRI brain 08/31/2020: - Extra-axial FLAIR hyperintensity along the posterior cerebral hemispheres measuring up to 3 mm in thickness, favored to reflect chronic  dural thickening given the patient's history and the clinical setting. Trace acute subdural hematomas are difficult to definitively exclude.  - Chronic subarachnoid blood products are also present along the posterior cerebral hemispheres bilaterally, presumed sequela  of remote trauma.  - Moderate cerebral atrophy.  - Mild cerebral white matter and pontine chronic small vessel ischemic disease.  - Small chronic infarct within the right cerebellum.  - Bilateral cranioplasties.  Impression: 79 year old black male with history of traumatic brain injury 2010 off meds presents with ischelic lower extremities and has seizure during hospitalization. Has history of TBI. Decision for AED will depend somewhat on EEG results.  Recommendations: - EEG - seizure precautions - we will continue to follow EEG and recommend if AEDs are recommended. - Should follow outpatient neurology in 6-8 weeks irrespective.  -- Parke Poisson Neurology  Attending Neurohospitalist Addendum Patient seen and examined with APP/Resident. Agree with the history and physical as documented above. Agree with the plan as documented, which I helped formulate. I have independently reviewed the chart, obtained history, review of systems and examined the patient.I have personally reviewed pertinent head/neck/spine imaging (CT/MRI). Please feel free to call with any questions. --- Amie Portland, MD [facility] Pager: [phone]  If 7pm to 7am, please call on call as listed on AMION.  ADDENDUM EEG-normal He does have residual blood duration parasomnias prior TBI but at this point EEG is completely normal. He is only had this 1 episode of concern for seizure and at this time I do not think we have enough to suggest that this was a true seizure. For that reason, I would recommend that we hold off on using AEDs. If these episodes were to recur, consider initiating antiepileptic at that time. I have relayed my plan to the primary team attending and APP on service.  Seizure precautions as listed in the consult  -- Amie Portland, MD [facility] Pager: [phone] If 7pm to 7am, please call on call as listed on AMION.

## 2020-09-01 NOTE — Procedures (Signed)
Patient Name: Eddie Hodge  MRN: [id number]  Epilepsy Attending: Lora Havens  Referring Physician/Provider: Claiborne Billings, PA Date: 09/01/2020 Duration: 24.56 mins  Patient history: 79 year old black male with history of traumatic brain injury 2010 off meds presents with ischemiic lower extremities and has seizure during hospitalization. EEG to evaluate for seizure.  Level of alertness: Awake  AEDs during EEG study: None  Technical aspects: This EEG study was done with scalp electrodes positioned according to the 10-20 International system of electrode placement. Electrical activity was acquired at a sampling rate of 500Hz  and reviewed with a high frequency filter of 70Hz  and a low frequency filter of 1Hz . EEG data were recorded continuously and digitally stored.   Description: The posterior dominant rhythm consists of 8 Hz activity of moderate voltage (25-35 uV) seen predominantly in posterior head regions, symmetric and reactive to eye opening and eye closing.   Hyperventilation and photic stimulation were not performed.     IMPRESSION: This study is within normal limits. No seizures or epileptiform discharges were seen throughout the recording.  Infiniti Hoefling Barbra Sarks

## 2020-09-02 ENCOUNTER — Encounter (HOSPITAL_COMMUNITY): Payer: Self-pay | Admitting: Vascular Surgery

## 2020-09-02 LAB — CULTURE, BLOOD (SINGLE): Culture: NO GROWTH

## 2020-09-02 MED ORDER — CEPHALEXIN 500 MG PO CAPS: 500.0000 mg | ORAL_CAPSULE | Freq: Four times a day (QID) | ORAL | 0 refills | Status: DC

## 2020-09-02 NOTE — TOC Transition Note (Signed)
Transition of Care (TOC) - CM/SW Discharge Note [name] RN, BSN Transitions of Care Unit 4E- RN Case Manager See Treatment Team for direct phone #    Patient Details  Name: Eddie Hodge MRN: [id number] Date of Birth: 08/02/1941  Transition of Care [facility]) CM/SW Contact:  [name], RN Phone Number: 09/02/2020, 11:37 AM   Clinical Narrative:    Pt stable for transition home today, per pt he has elected to return home and not go to SNF, daughter is at the bedside and agreeable to plan. Per conversation with daughter they have [place] at home, will need walker. DME orders have been placed. Discussed HH which they are agreeable to - choice offered- Per CMS guidelines from medicare.gov website with star ratings (copy placed in shadow chart)- per daughter she does not have a preference and defers to this Probation officer to secure and agency for needed services. Confirmed local address for daughter where pt is staying.  [address], [place] [address] [name]- daughter- phone- [phone]  Pt is new to Eliquis- no prescription drug coverage found- pharmacy provided pt with 30 day free card, and this Probation officer provided daughter with pt assistance application- daughter to f/u to see if pt has any drug coverage cards at home and provide all insurance cards to pharmacy.   Call made to [facility] for DME need- RW to be delivered to room prior to discharge  Call made to [facility] with [name] for [facility] referral- referral has been accepted for PT/OT needs.     Final next level of care: [place] Barriers to Discharge: Barriers Resolved   Patient Goals and CMS Choice Patient states their goals for this hospitalization and ongoing recovery are:: TO get stronger and retrn home. CMS Medicare.gov Compare Post Acute Care list provided to:: Patient Choice offered to / list presented to : Patient, Adult Children  Discharge Placement               Home with [facility]         Discharge Plan and Services In-house Referral: Clinical Social Work Discharge Planning Services: CM Consult Post Acute Care Choice: Durable Medical Equipment, Home Health          DME Arranged: 3-N-1, Walker rolling DME Agency: [employer] Date DME Agency Contacted: 09/02/20 Time DME Agency Contacted: 37 Representative spoke with at DME Agency: [place]: PT, OT [place] Agency: [place] Date [place]: 09/02/20 Time [place]: 1136 Representative spoke with at [place]: [place] ([place]) Interventions     Readmission Risk Interventions Readmission Risk Prevention Plan 09/02/2020  Post Dischage Appt Complete  Medication Screening Complete  Transportation Screening Complete  Some recent data might be hidden

## 2020-09-02 NOTE — Progress Notes (Signed)
Order received to discharge patient.  Telemetry monitor removed and CCMD notified.  PIV access removed.  Discharge instructions, follow up, medications and instructions for their use discussed with patient. 

## 2020-09-02 NOTE — Progress Notes (Signed)
Physical Therapy Treatment Patient Details Name: Eddie Hodge MRN: [id number] DOB: 1940-10-22 Today's Date: 09/02/2020    History of Present Illness Pt is an 79 year old man with PMH of colon cancer and alcohol abuse who was admitted on 08/22/20 with B LE critical ischemia. Underwent B femoral artery thrombectomy, L iliac thrombectomy and 4 compartment fasciotomies B LEs.    PT Comments    Pt tolerated session well this am and will plan to d/c home with support from his daughter.  Will inform supervising PT to updated recommendations at this time.  Pt has no episodes this session and HEP issued for home use.  Educated pt and daughter to take gt belt home for home use and for self stretching.     Follow Up Recommendations  Home health PT;Supervision/Assistance - 24 hour     Equipment Recommendations  Rolling walker with 5" wheels    Recommendations for Other Services       Precautions / Restrictions Precautions Precautions: Fall Precaution Comments: watch HR, seizure like activity on 08/30/20 Restrictions Weight Bearing Restrictions: No    Mobility  Bed Mobility Overal bed mobility: Needs Assistance Bed Mobility: Supine to Sit;Sit to Supine     Supine to sit: Supervision Sit to supine: Supervision   General bed mobility comments: Pt supine in bed on arrival and able to move out of bed and back into bed.  Transfers Overall transfer level: Needs assistance Equipment used: Rolling walker (2 wheeled) Transfers: Sit to/from Stand Sit to Stand: Supervision         General transfer comment: Supervision, slow and guarded with cues for sequencing and reaching back for seated surface.  Ambulation/Gait Ambulation/Gait assistance: Min guard Gait Distance (Feet): 140 Feet Assistive device: Rolling walker (2 wheeled) Gait Pattern/deviations: Step-through pattern;Decreased stride length;Trunk flexed     General Gait Details: Cues for upper trunk control and RW safety this  session.  Pt tolerated session well without episode this session.   Stairs             Wheelchair Mobility    Modified Rankin (Stroke Patients Only)       Balance Overall balance assessment: Needs assistance Sitting-balance support: No upper extremity supported;Feet supported Sitting balance-Leahy Scale: Good       Standing balance-Leahy Scale: Fair                              Cognition Arousal/Alertness: Awake/alert Behavior During Therapy: WFL for tasks assessed/performed Overall Cognitive Status: Within Functional Limits for tasks assessed                                 General Comments: pt HOH      Exercises General Exercises - Lower Extremity Ankle Circles/Pumps: AROM;Both;15 reps;Supine Quad Sets: AROM;Both;10 reps;Supine Heel Slides: AROM;Both;10 reps;Supine Hip ABduction/ADduction: AROM;Both;10 reps;Supine Straight Leg Raises: AROM;Both;10 reps;Supine Other Exercises Other Exercises: Performed B HS/HC stretch with gt belt x 3 reps each for 15 sec holds.    General Comments        Pertinent Vitals/Pain Pain Assessment: No/denies pain Pain Score: 0-No pain Pain Location: bilat LEs Pain Descriptors / Indicators: Discomfort    Home Living                      Prior Function  PT Goals (current goals can now be found in the care plan section) Acute Rehab PT Goals Patient Stated Goal: I'd like to go home Potential to Achieve Goals: Good Progress towards PT goals: Progressing toward goals    Frequency    Min 3X/week      PT Plan Discharge plan needs to be updated    Co-evaluation              AM-PAC PT "6 Clicks" Mobility   Outcome Measure  Help needed turning from your back to your side while in a flat bed without using bedrails?: A Little Help needed moving from lying on your back to sitting on the side of a flat bed without using bedrails?: A Little Help needed moving to and  from a bed to a chair (including a wheelchair)?: A Little Help needed standing up from a chair using your arms (e.g., wheelchair or bedside chair)?: A Little Help needed to walk in hospital room?: A Little Help needed climbing 3-5 steps with a railing? : A Little 6 Click Score: 18    End of Session Equipment Utilized During Treatment: Gait belt Activity Tolerance: Patient tolerated treatment well Patient left: in bed;with call bell/phone within reach;with bed alarm set Nurse Communication: Mobility status PT Visit Diagnosis: Unsteadiness on feet (R26.81)     Time: 9233-0076 PT Time Calculation (min) (ACUTE ONLY): 27 min  Charges:  $Gait Training: 8-22 mins $Therapeutic Exercise: 8-22 mins                     Eddie Hodge , PTA Acute Rehabilitation Services Pager [phone] Office [phone]     Eddie Hodge Eddie Hodge 09/02/2020, 12:53 PM

## 2020-09-02 NOTE — Progress Notes (Addendum)
Progress Note    09/02/2020 7:50 AM 12 Days Post-Op  Subjective:  No complaints. Wants to go home   Vitals:   09/02/20 0430 09/02/20 0745  BP: 113/61 126/73  Pulse: 75 84  Resp: (!) 21 18  Temp: 98.9 F (37.2 C) 98.4 F (36.9 C)  SpO2: 99% 98%   Physical Exam: Cardiac: regular rate and rhythm Lungs: non labored Incisions:  Bilateral groins intact. Right groin with area of fullness. Firm. Stable Seroma vs hematoma. Bilateral Faciotomy incisions well approximated. Ischemic skin changes to bilateral lateral incisions. Staples intact. No drainage Extremities:  Bilateral lower extremities well perfused and warm. Brisk doppler signals to PT/ Dp bilaterally. Intact bullous lesions of left distal anterior leg and dorsum of foot. Dressings present. Bilateral feet warm Abdomen:  Flat, soft non tender Neurologic: alert and oriented  CBC    Component Value Date/Time   WBC 8.6 08/30/2020 0144   RBC 2.12 (L) 08/30/2020 0144   HGB 7.1 (L) 08/30/2020 0144   HGB 12.9 (L) 03/08/2008 1012   HCT 21.5 (L) 08/30/2020 0144   HCT 39.4 03/08/2008 1012   PLT 405 (H) 08/30/2020 0144   PLT 222 03/08/2008 1012   MCV 101.4 (H) 08/30/2020 0144   MCV 97.7 03/08/2008 1012   MCH 33.5 08/30/2020 0144   MCHC 33.0 08/30/2020 0144   RDW 13.3 08/30/2020 0144   RDW 12.4 03/08/2008 1012   LYMPHSABS 1.3 08/29/2020 0230   LYMPHSABS 2.0 03/08/2008 1012   MONOABS 1.2 (H) 08/29/2020 0230   MONOABS 0.5 03/08/2008 1012   EOSABS 0.2 08/29/2020 0230   EOSABS 0.2 03/08/2008 1012   BASOSABS 0.0 08/29/2020 0230   BASOSABS 0.1 03/08/2008 1012    BMET    Component Value Date/Time   NA 140 08/28/2020 1355   K 3.6 08/28/2020 1355   CL 107 08/28/2020 1355   CO2 23 08/28/2020 1355   GLUCOSE 99 08/28/2020 1355   BUN 22 08/28/2020 1355   CREATININE 1.88 (H) 08/28/2020 1355   CALCIUM 9.2 08/28/2020 1355   GFRNONAA 36 (L) 08/28/2020 1355   GFRAA  03/14/2009 0625    >60        The eGFR has been  calculated using the MDRD equation. This calculation has not been validated in all clinical situations. eGFR's persistently <60 mL/min signify possible Chronic Kidney Disease.    INR    Component Value Date/Time   INR 1.1 08/21/2020 1821     Intake/Output Summary (Last 24 hours) at 09/02/2020 0750 Last data filed at 09/02/2020 0746 Gross per 24 hour  Intake 240 ml  Output 850 ml  Net -610 ml     Assessment/Plan:  79 y.o. male is s/p right lower extremity thrombectomy ofcommon femoral, profunda, SFA, popliteal and tibialarteriesfromrightfemoral approach Left iliac arterythrombectomy from left femoral approach.Left lower extremity thrombectomy ofcommon femoral, profunda, SFAfrom left femoral approach. Bilateral lower extremity 4 compartment fasciotomies 12 Days Post-Op. Lower extremities well perfused with Brisk PT/DP signals. Faciotomy sites with some ischemic skin changes. Stable though. Remains afebrile. No leukocytosis. Appreciate neurology Assistance. EEG normal. No seizure activity noted. No AEDs recommended. Continue Seizure precautions. PT/ OT recommended SNF- previously had bed. TOC consulted for discharge planning- home vs SNF. Anticipate d/c likely later today or tomorrow once discharge arranged. He will go home on Eliquis and statin. He already has post op appointment arranged for 10/02/19 with Dr. Carlis Abbott for staple removal  DVT prophylaxis:  Apixaban   Karoline Caldwell, PA-C [facility] [phone]  09/02/2020 7:50 AM   I have seen and evaluated the patient. I agree with the PA note as documented above.  78 year old male now status post bilateral lower extremity thrombectomies and left iliac thrombectomy the day before Thanksgiving for bilateral acute limb ischemia.  We did fasciotomies and closed this with staples.  He has very brisk Doppler signals and is motor sensory intact at his baseline.  He had some skin darkening on the lateral fasciotomy  sites bilaterally but these are nice and dry with no infection and do not appear to require any wound care at this time.Marland Kitchen  He had some fevers last week and had a negative infectious work-up.  I briefly put him on Ancef and will plan to transition to Keflex for 5 more days treating some cellulitis of lateral fasciotomy.  I discussed with daughter recommendation for SNF and she ultimately wants to take him home.  Will order rolling wheelchair per PT recommendations.  He will need to continue his Eliquis that we started here in the hospital.  I will see him in 3 weeks for wound checks and to get the staples out of his fasciotomies.  Neurology has also evaluated him given this question of seizure and does not feel he needs any seizure prophylaxis or seizure medicine and recommend outpatient follow-up.  Marty Heck, MD [facility] Office: [phone]

## 2020-09-02 NOTE — Care Management Important Message (Signed)
Important Message  Patient Details  Name: Eddie Hodge MRN: [id number] Date of Birth: 08-Aug-1941   Medicare Important Message Given:  Yes     [name] 09/02/2020, 10:04 AM

## 2020-09-04 ENCOUNTER — Telehealth: Payer: Self-pay | Admitting: *Deleted

## 2020-09-04 NOTE — Telephone Encounter (Signed)
Patient's daughter called stating the incision has changed and does not look "good".  She denies fever, however there has been bleeding. An appointment was scheduled for 09/05/2020 with the PA.

## 2020-09-05 ENCOUNTER — Ambulatory Visit (INDEPENDENT_AMBULATORY_CARE_PROVIDER_SITE_OTHER): Payer: Self-pay | Admitting: Physician Assistant

## 2020-09-05 ENCOUNTER — Encounter: Payer: Self-pay | Admitting: Physician Assistant

## 2020-09-05 ENCOUNTER — Other Ambulatory Visit: Payer: Self-pay

## 2020-09-05 VITALS — BP 127/75 | HR 96 | Temp 98.4°F | Resp 20 | Ht 72.0 in | Wt 161.3 lb

## 2020-09-05 DIAGNOSIS — T8189XD Other complications of procedures, not elsewhere classified, subsequent encounter: Secondary | ICD-10-CM

## 2020-09-05 DIAGNOSIS — I998 Other disorder of circulatory system: Secondary | ICD-10-CM

## 2020-09-05 MED ORDER — COLLAGENASE 250 UNIT/GM EX OINT: 1.0000 "application " | TOPICAL_OINTMENT | Freq: Every day | CUTANEOUS | 1 refills | Status: DC

## 2020-09-05 MED ORDER — OXYCODONE-ACETAMINOPHEN 5-325 MG PO TABS: 1.0000 | ORAL_TABLET | Freq: Four times a day (QID) | ORAL | 0 refills | Status: DC | PRN

## 2020-09-05 NOTE — Progress Notes (Signed)
  POST OPERATIVE OFFICE NOTE    CC:  F/u for surgery  HPI:  This is a 79 y.o. male who is s/p B LE thrombectomies with B 4 compartment fasciotomies.   on 08/22/2020 by Dr. Carlis Abbott.  He was discharged 09/02/20 on Eliquis for anticoagulation.  His daughter called with concerns of healing issues.  He denise fever and chills.   Pt returns today for follow up with concerns of incisional wounds with dark skin changes.      No Known Allergies  Current Outpatient Medications  Medication Sig Dispense Refill  . apixaban (ELIQUIS) 5 MG TABS tablet Take 1 tablet (5 mg total) by mouth 2 (two) times daily. 60 tablet 5  . cephALEXin (KEFLEX) 500 MG capsule Take 1 capsule (500 mg total) by mouth 4 (four) times daily for 5 days. 20 capsule 0  . folic acid (FOLVITE) 1 MG tablet Take 1 tablet (1 mg total) by mouth daily. 30 tablet 5  . Omega-3 Fatty Acids (FISH OIL) 1000 MG CAPS Take 1,000 mg by mouth daily.    Marland Kitchen oxyCODONE-acetaminophen (PERCOCET/ROXICET) 5-325 MG tablet Take 1 tablet by mouth every 4 (four) hours as needed for moderate pain. 30 tablet 0  . rosuvastatin (CRESTOR) 20 MG tablet Take 1 tablet (20 mg total) by mouth daily. 30 tablet 3  . thiamine 100 MG tablet Take 1 tablet (100 mg total) by mouth daily. 30 tablet 3   No current facility-administered medications for this visit.     ROS:  See HPI  Physical Exam:        Incision:  B skin ischemia lateral fasciotomy sites, Doppler signal intact B LE DP/PT/Peroneal right brisker than left. Extremities:  Moderated edema left LE, blisters auto evacuated, no skin damage below blister. Neuro:  Sensation intact B feet   Assessment/Plan:  This is a 79 y.o. male who is s/p: Procedure: 1.  Right lower extremity thrombectomy of common femoral, profunda, SFA, popliteal and tibial arteries from right femoral approach 2.  Left iliac artery thrombectomy from left femoral approach 3.  Left lower extremity thrombectomy of common femoral, profunda,  SFA from left femoral approach 4.  Bilateral lower extremity 4 compartment fasciotomies  Ischemic skin changes lateral fasciotomy sites with likely hematoma.  Dr. Oneida Alar examined the wounds and asked that the staples be removed from the left lateral and the distal 1/2 lateral right LE.  He wants to start Santyl with dry dressing daily.  I ordered [facility] RN for wound checks.  He will f/u next week with Dr. Carlis Abbott.      Roxy Horseman PA-C [facility] [phone]  Clinic MD:  Oneida Alar

## 2020-09-06 ENCOUNTER — Inpatient Hospital Stay (HOSPITAL_COMMUNITY)
Admission: EM | Admit: 2020-09-06 | Discharge: 2020-09-12 | DRG: 902 | Disposition: A | Discharge status: Home Health | Payer: Medicare Other | Attending: Vascular Surgery | Admitting: Vascular Surgery

## 2020-09-06 ENCOUNTER — Other Ambulatory Visit: Payer: Self-pay

## 2020-09-06 ENCOUNTER — Emergency Department (HOSPITAL_COMMUNITY): Payer: Medicare Other

## 2020-09-06 ENCOUNTER — Telehealth: Payer: Self-pay

## 2020-09-06 ENCOUNTER — Encounter (HOSPITAL_COMMUNITY): Payer: Self-pay | Admitting: Emergency Medicine

## 2020-09-06 DIAGNOSIS — T8130XA Disruption of wound, unspecified, initial encounter: Secondary | ICD-10-CM

## 2020-09-06 DIAGNOSIS — L7682 Other postprocedural complications of skin and subcutaneous tissue: Secondary | ICD-10-CM | POA: Diagnosis present

## 2020-09-06 DIAGNOSIS — I998 Other disorder of circulatory system: Secondary | ICD-10-CM

## 2020-09-06 DIAGNOSIS — Y838 Other surgical procedures as the cause of abnormal reaction of the patient, or of later complication, without mention of misadventure at the time of the procedure: Secondary | ICD-10-CM | POA: Diagnosis present

## 2020-09-06 DIAGNOSIS — Z20822 Contact with and (suspected) exposure to covid-19: Secondary | ICD-10-CM | POA: Diagnosis present

## 2020-09-06 DIAGNOSIS — R208 Other disturbances of skin sensation: Secondary | ICD-10-CM | POA: Diagnosis present

## 2020-09-06 DIAGNOSIS — F17210 Nicotine dependence, cigarettes, uncomplicated: Secondary | ICD-10-CM | POA: Diagnosis present

## 2020-09-06 DIAGNOSIS — T8131XA Disruption of external operation (surgical) wound, not elsewhere classified, initial encounter: Secondary | ICD-10-CM | POA: Diagnosis not present

## 2020-09-06 DIAGNOSIS — Y929 Unspecified place or not applicable: Secondary | ICD-10-CM

## 2020-09-06 DIAGNOSIS — Z79899 Other long term (current) drug therapy: Secondary | ICD-10-CM

## 2020-09-06 DIAGNOSIS — D62 Acute posthemorrhagic anemia: Secondary | ICD-10-CM | POA: Diagnosis not present

## 2020-09-06 DIAGNOSIS — T8189XA Other complications of procedures, not elsewhere classified, initial encounter: Secondary | ICD-10-CM

## 2020-09-06 DIAGNOSIS — H9190 Unspecified hearing loss, unspecified ear: Secondary | ICD-10-CM | POA: Diagnosis present

## 2020-09-06 DIAGNOSIS — Z7901 Long term (current) use of anticoagulants: Secondary | ICD-10-CM

## 2020-09-06 DIAGNOSIS — Z85038 Personal history of other malignant neoplasm of large intestine: Secondary | ICD-10-CM

## 2020-09-06 DIAGNOSIS — I96 Gangrene, not elsewhere classified: Secondary | ICD-10-CM | POA: Diagnosis present

## 2020-09-06 HISTORY — DX: Malignant (primary) neoplasm, unspecified: C80.1

## 2020-09-06 HISTORY — DX: Other disorder of circulatory system: I99.8

## 2020-09-06 HISTORY — DX: Unspecified hearing loss, unspecified ear: H91.90

## 2020-09-06 NOTE — ED Triage Notes (Signed)
Patient reports surgical wound dehiscence at right shin this afternoon , no bleeding/dressing applied by family .

## 2020-09-06 NOTE — Telephone Encounter (Signed)
S/w Jacqlyn Larsen - nurse case manager @ [facility] - gave verbal order for [facility] nursing/wound care - santyl + dry dressing to bilateral LE incisions.

## 2020-09-07 ENCOUNTER — Emergency Department (HOSPITAL_COMMUNITY): Payer: Medicare Other | Admitting: Anesthesiology

## 2020-09-07 ENCOUNTER — Encounter (HOSPITAL_COMMUNITY)
Admission: EM | Disposition: A | Discharge status: Home Health | Payer: Self-pay | Source: Home / Self Care | Attending: Vascular Surgery

## 2020-09-07 ENCOUNTER — Encounter (HOSPITAL_COMMUNITY): Payer: Self-pay | Admitting: Certified Registered Nurse Anesthetist

## 2020-09-07 DIAGNOSIS — Z85038 Personal history of other malignant neoplasm of large intestine: Secondary | ICD-10-CM | POA: Diagnosis not present

## 2020-09-07 DIAGNOSIS — R208 Other disturbances of skin sensation: Secondary | ICD-10-CM | POA: Diagnosis present

## 2020-09-07 DIAGNOSIS — T8131XA Disruption of external operation (surgical) wound, not elsewhere classified, initial encounter: Secondary | ICD-10-CM | POA: Diagnosis present

## 2020-09-07 DIAGNOSIS — Z7901 Long term (current) use of anticoagulants: Secondary | ICD-10-CM | POA: Diagnosis not present

## 2020-09-07 DIAGNOSIS — Y838 Other surgical procedures as the cause of abnormal reaction of the patient, or of later complication, without mention of misadventure at the time of the procedure: Secondary | ICD-10-CM | POA: Diagnosis present

## 2020-09-07 DIAGNOSIS — Z20822 Contact with and (suspected) exposure to covid-19: Secondary | ICD-10-CM | POA: Diagnosis present

## 2020-09-07 DIAGNOSIS — D62 Acute posthemorrhagic anemia: Secondary | ICD-10-CM | POA: Diagnosis not present

## 2020-09-07 DIAGNOSIS — Z79899 Other long term (current) drug therapy: Secondary | ICD-10-CM | POA: Diagnosis not present

## 2020-09-07 DIAGNOSIS — I96 Gangrene, not elsewhere classified: Secondary | ICD-10-CM | POA: Diagnosis present

## 2020-09-07 DIAGNOSIS — Y929 Unspecified place or not applicable: Secondary | ICD-10-CM | POA: Diagnosis not present

## 2020-09-07 DIAGNOSIS — F17210 Nicotine dependence, cigarettes, uncomplicated: Secondary | ICD-10-CM | POA: Diagnosis present

## 2020-09-07 DIAGNOSIS — T8189XA Other complications of procedures, not elsewhere classified, initial encounter: Secondary | ICD-10-CM | POA: Diagnosis present

## 2020-09-07 DIAGNOSIS — L7682 Other postprocedural complications of skin and subcutaneous tissue: Secondary | ICD-10-CM | POA: Diagnosis present

## 2020-09-07 DIAGNOSIS — H9190 Unspecified hearing loss, unspecified ear: Secondary | ICD-10-CM | POA: Diagnosis present

## 2020-09-07 DIAGNOSIS — M9689 Other intraoperative and postprocedural complications and disorders of the musculoskeletal system: Secondary | ICD-10-CM

## 2020-09-07 DIAGNOSIS — T8130XA Disruption of wound, unspecified, initial encounter: Secondary | ICD-10-CM | POA: Diagnosis present

## 2020-09-07 HISTORY — PX: I & D EXTREMITY: SHX5045

## 2020-09-07 HISTORY — PX: APPLICATION OF WOUND VAC: SHX5189

## 2020-09-07 LAB — CBC
HCT: 28.4 % — ABNORMAL LOW (ref 39.0–52.0)
Hemoglobin: 8.8 g/dL — ABNORMAL LOW (ref 13.0–17.0)
MCH: 34.2 pg — ABNORMAL HIGH (ref 26.0–34.0)
MCHC: 31 g/dL (ref 30.0–36.0)
MCV: 110.5 fL — ABNORMAL HIGH (ref 80.0–100.0)
Platelets: 393 10*3/uL (ref 150–400)
RBC: 2.57 MIL/uL — ABNORMAL LOW (ref 4.22–5.81)
RDW: 15.9 % — ABNORMAL HIGH (ref 11.5–15.5)
WBC: 9.7 10*3/uL (ref 4.0–10.5)
nRBC: 0.2 % (ref 0.0–0.2)

## 2020-09-07 LAB — BASIC METABOLIC PANEL
Anion gap: 7 (ref 5–15)
BUN: 26 mg/dL — ABNORMAL HIGH (ref 8–23)
CO2: 26 mmol/L (ref 22–32)
Calcium: 9.8 mg/dL (ref 8.9–10.3)
Chloride: 111 mmol/L (ref 98–111)
Creatinine, Ser: 1.47 mg/dL — ABNORMAL HIGH (ref 0.61–1.24)
GFR, Estimated: 48 mL/min — ABNORMAL LOW (ref >60)
Glucose, Bld: 96 mg/dL (ref 70–99)
Potassium: 4.9 mmol/L (ref 3.5–5.1)
Sodium: 144 mmol/L (ref 135–145)

## 2020-09-07 LAB — RESP PANEL BY RT-PCR (FLU A&B, COVID) ARPGX2
Influenza A by PCR: NEGATIVE
Influenza B by PCR: NEGATIVE
SARS Coronavirus 2 by RT PCR: NEGATIVE

## 2020-09-07 SURGERY — IRRIGATION AND DEBRIDEMENT EXTREMITY
Anesthesia: General | Site: Leg Lower | Laterality: Bilateral

## 2020-09-07 MED ORDER — ALUM & MAG HYDROXIDE-SIMETH 200-200-20 MG/5ML PO SUSP: 15.0000 mL | ORAL | Status: DC | PRN

## 2020-09-07 MED ORDER — OXYCODONE HCL 5 MG PO TABS: 5.0000 mg | ORAL_TABLET | Freq: Once | ORAL | Status: DC | PRN

## 2020-09-07 MED ORDER — PHENYLEPHRINE 40 MCG/ML (10ML) SYRINGE FOR IV PUSH (FOR BLOOD PRESSURE SUPPORT)
PREFILLED_SYRINGE | INTRAVENOUS | Status: AC
  Filled 2020-09-07: qty 10

## 2020-09-07 MED ORDER — ROCURONIUM BROMIDE 10 MG/ML (PF) SYRINGE
PREFILLED_SYRINGE | INTRAVENOUS | Status: DC | PRN
  Administered 2020-09-07: 50 mg via INTRAVENOUS

## 2020-09-07 MED ORDER — FENTANYL CITRATE (PF) 250 MCG/5ML IJ SOLN
INTRAMUSCULAR | Status: AC
  Filled 2020-09-07: qty 5

## 2020-09-07 MED ORDER — MORPHINE SULFATE (PF) 2 MG/ML IV SOLN
2.0000 mg | INTRAVENOUS | Status: DC | PRN
Start: 2020-09-07 — End: 2020-09-12

## 2020-09-07 MED ORDER — HYDRALAZINE HCL 20 MG/ML IJ SOLN: 5.0000 mg | INTRAMUSCULAR | Status: DC | PRN

## 2020-09-07 MED ORDER — HYDROMORPHONE HCL 1 MG/ML IJ SOLN
0.2500 mg | INTRAMUSCULAR | Status: DC | PRN
Start: 2020-09-07 — End: 2020-09-07

## 2020-09-07 MED ORDER — PROPOFOL 10 MG/ML IV BOLUS
INTRAVENOUS | Status: AC
  Filled 2020-09-07: qty 20

## 2020-09-07 MED ORDER — CEFAZOLIN SODIUM-DEXTROSE 2-4 GM/100ML-% IV SOLN
INTRAVENOUS | Status: AC
  Filled 2020-09-07: qty 100

## 2020-09-07 MED ORDER — SUGAMMADEX SODIUM 200 MG/2ML IV SOLN
INTRAVENOUS | Status: DC | PRN
  Administered 2020-09-07: 200 mg via INTRAVENOUS

## 2020-09-07 MED ORDER — ACETAMINOPHEN 650 MG RE SUPP: 325.0000 mg | RECTAL | Status: DC | PRN

## 2020-09-07 MED ORDER — ACETAMINOPHEN 325 MG PO TABS
325.0000 mg | ORAL_TABLET | ORAL | Status: DC | PRN
Start: 2020-09-07 — End: 2020-09-12
  Administered 2020-09-09: 650 mg via ORAL
  Filled 2020-09-07 (×2): qty 2

## 2020-09-07 MED ORDER — PHENYLEPHRINE 40 MCG/ML (10ML) SYRINGE FOR IV PUSH (FOR BLOOD PRESSURE SUPPORT)
PREFILLED_SYRINGE | INTRAVENOUS | Status: DC | PRN
  Administered 2020-09-07 (×3): 80 ug via INTRAVENOUS

## 2020-09-07 MED ORDER — LIDOCAINE HCL (PF) 2 % IJ SOLN
INTRAMUSCULAR | Status: AC
  Filled 2020-09-07: qty 5

## 2020-09-07 MED ORDER — THIAMINE HCL 100 MG PO TABS
100.0000 mg | ORAL_TABLET | Freq: Every day | ORAL | Status: DC
  Administered 2020-09-07 – 2020-09-12 (×6): 100 mg via ORAL
  Filled 2020-09-07 (×6): qty 1

## 2020-09-07 MED ORDER — HEPARIN (PORCINE) 25000 UT/250ML-% IV SOLN
1250.0000 [IU]/h | INTRAVENOUS | Status: DC
  Administered 2020-09-07: 23:00:00 1100 [IU]/h via INTRAVENOUS
  Filled 2020-09-07: qty 250

## 2020-09-07 MED ORDER — ONDANSETRON HCL 4 MG/2ML IJ SOLN
INTRAMUSCULAR | Status: AC
  Filled 2020-09-07: qty 2

## 2020-09-07 MED ORDER — OXYCODONE HCL 5 MG/5ML PO SOLN: 5.0000 mg | Freq: Once | ORAL | Status: DC | PRN

## 2020-09-07 MED ORDER — FENTANYL CITRATE (PF) 250 MCG/5ML IJ SOLN
INTRAMUSCULAR | Status: DC | PRN
  Administered 2020-09-07: 50 ug via INTRAVENOUS
  Administered 2020-09-07: 100 ug via INTRAVENOUS

## 2020-09-07 MED ORDER — CHLORHEXIDINE GLUCONATE 0.12 % MT SOLN: 15.0000 mL | Freq: Once | OROMUCOSAL | Status: AC

## 2020-09-07 MED ORDER — METOPROLOL TARTRATE 5 MG/5ML IV SOLN: 2.0000 mg | INTRAVENOUS | Status: DC | PRN

## 2020-09-07 MED ORDER — OXYCODONE-ACETAMINOPHEN 5-325 MG PO TABS
1.0000 | ORAL_TABLET | ORAL | Status: DC | PRN
  Administered 2020-09-10: 1 via ORAL
  Filled 2020-09-07 (×2): qty 1

## 2020-09-07 MED ORDER — 0.9 % SODIUM CHLORIDE (POUR BTL) OPTIME
TOPICAL | Status: DC | PRN
  Administered 2020-09-07: 15:00:00 1000 mL

## 2020-09-07 MED ORDER — PROMETHAZINE HCL 25 MG/ML IJ SOLN
6.2500 mg | INTRAMUSCULAR | Status: DC | PRN
Start: 2020-09-07 — End: 2020-09-07

## 2020-09-07 MED ORDER — PHENOL 1.4 % MT LIQD: 1.0000 | OROMUCOSAL | Status: DC | PRN

## 2020-09-07 MED ORDER — GUAIFENESIN-DM 100-10 MG/5ML PO SYRP: 15.0000 mL | ORAL_SOLUTION | ORAL | Status: DC | PRN

## 2020-09-07 MED ORDER — CHLORHEXIDINE GLUCONATE 0.12 % MT SOLN
OROMUCOSAL | Status: AC
  Administered 2020-09-07: 13:00:00 15 mL via OROMUCOSAL
  Filled 2020-09-07: qty 15

## 2020-09-07 MED ORDER — LABETALOL HCL 5 MG/ML IV SOLN: 10.0000 mg | INTRAVENOUS | Status: DC | PRN

## 2020-09-07 MED ORDER — LACTATED RINGERS IV SOLN: INTRAVENOUS | Status: DC

## 2020-09-07 MED ORDER — PROPOFOL 10 MG/ML IV BOLUS
INTRAVENOUS | Status: DC | PRN
  Administered 2020-09-07: 130 mg via INTRAVENOUS

## 2020-09-07 MED ORDER — ROSUVASTATIN CALCIUM 20 MG PO TABS
20.0000 mg | ORAL_TABLET | Freq: Every day | ORAL | Status: DC
  Administered 2020-09-07 – 2020-09-11 (×5): 20 mg via ORAL
  Filled 2020-09-07 (×5): qty 1

## 2020-09-07 MED ORDER — ONDANSETRON HCL 4 MG/2ML IJ SOLN: 4.0000 mg | Freq: Four times a day (QID) | INTRAMUSCULAR | Status: DC | PRN

## 2020-09-07 MED ORDER — ALBUMIN HUMAN 5 % IV SOLN: INTRAVENOUS | Status: DC | PRN

## 2020-09-07 MED ORDER — POTASSIUM CHLORIDE CRYS ER 20 MEQ PO TBCR
20.0000 meq | EXTENDED_RELEASE_TABLET | Freq: Once | ORAL | Status: DC
  Filled 2020-09-07: qty 2

## 2020-09-07 MED ORDER — DEXAMETHASONE SODIUM PHOSPHATE 10 MG/ML IJ SOLN
INTRAMUSCULAR | Status: DC | PRN
  Administered 2020-09-07: 5 mg via INTRAVENOUS

## 2020-09-07 MED ORDER — DEXAMETHASONE SODIUM PHOSPHATE 10 MG/ML IJ SOLN
INTRAMUSCULAR | Status: AC
  Filled 2020-09-07: qty 1

## 2020-09-07 MED ORDER — ONDANSETRON HCL 4 MG/2ML IJ SOLN
INTRAMUSCULAR | Status: DC | PRN
  Administered 2020-09-07: 4 mg via INTRAVENOUS

## 2020-09-07 MED ORDER — PANTOPRAZOLE SODIUM 40 MG PO TBEC
40.0000 mg | DELAYED_RELEASE_TABLET | Freq: Every day | ORAL | Status: DC
  Administered 2020-09-07 – 2020-09-12 (×6): 40 mg via ORAL
  Filled 2020-09-07 (×6): qty 1

## 2020-09-07 MED ORDER — SODIUM CHLORIDE 0.9 % IV SOLN: INTRAVENOUS | Status: AC

## 2020-09-07 MED ORDER — ROCURONIUM BROMIDE 10 MG/ML (PF) SYRINGE
PREFILLED_SYRINGE | INTRAVENOUS | Status: AC
  Filled 2020-09-07: qty 10

## 2020-09-07 MED ORDER — CEFAZOLIN SODIUM-DEXTROSE 2-4 GM/100ML-% IV SOLN
2.0000 g | Freq: Once | INTRAVENOUS | Status: AC
  Administered 2020-09-07: 15:00:00 2 g via INTRAVENOUS

## 2020-09-07 MED ORDER — LIDOCAINE 2% (20 MG/ML) 5 ML SYRINGE
INTRAMUSCULAR | Status: DC | PRN
  Administered 2020-09-07: 60 mg via INTRAVENOUS

## 2020-09-07 SURGICAL SUPPLY — 42 items
BNDG ELASTIC 4X5.8 VLCR STR LF (GAUZE/BANDAGES/DRESSINGS) IMPLANT
BNDG ELASTIC 6X5.8 VLCR STR LF (GAUZE/BANDAGES/DRESSINGS) IMPLANT
BNDG GAUZE ELAST 4 BULKY (GAUZE/BANDAGES/DRESSINGS) IMPLANT
CANISTER SUCT 3000ML PPV (MISCELLANEOUS) ×3 IMPLANT
CLIP VESOCCLUDE MED 6/CT (CLIP) ×3 IMPLANT
CLIP VESOCCLUDE SM WIDE 6/CT (CLIP) ×3 IMPLANT
CONNECTOR Y ATS VAC SYSTEM (MISCELLANEOUS) ×3 IMPLANT
COVER SURGICAL LIGHT HANDLE (MISCELLANEOUS) ×3 IMPLANT
COVER WAND RF STERILE (DRAPES) ×3 IMPLANT
DRAPE HALF SHEET 40X57 (DRAPES) IMPLANT
DRAPE U-SHAPE 76X120 STRL (DRAPES) IMPLANT
DRSG VAC ATS MED SENSATRAC (GAUZE/BANDAGES/DRESSINGS) ×6 IMPLANT
ELECT REM PT RETURN 9FT ADLT (ELECTROSURGICAL) ×3
ELECTRODE REM PT RTRN 9FT ADLT (ELECTROSURGICAL) ×1 IMPLANT
GAUZE SPONGE 4X4 12PLY STRL (GAUZE/BANDAGES/DRESSINGS) ×3 IMPLANT
GAUZE XEROFORM 5X9 LF (GAUZE/BANDAGES/DRESSINGS) IMPLANT
GLOVE BIOGEL PI IND STRL 7.5 (GLOVE) ×1 IMPLANT
GLOVE BIOGEL PI INDICATOR 7.5 (GLOVE) ×2
GLOVE SURG SS PI 7.5 STRL IVOR (GLOVE) ×3 IMPLANT
GOWN STRL REUS W/ TWL LRG LVL3 (GOWN DISPOSABLE) ×2 IMPLANT
GOWN STRL REUS W/ TWL XL LVL3 (GOWN DISPOSABLE) ×1 IMPLANT
GOWN STRL REUS W/TWL LRG LVL3 (GOWN DISPOSABLE) ×6
GOWN STRL REUS W/TWL XL LVL3 (GOWN DISPOSABLE) ×3
HANDPIECE INTERPULSE COAX TIP (DISPOSABLE)
IV NS IRRIG 3000ML ARTHROMATIC (IV SOLUTION) ×3 IMPLANT
KIT BASIN OR (CUSTOM PROCEDURE TRAY) ×3 IMPLANT
KIT TURNOVER KIT B (KITS) ×3 IMPLANT
NS IRRIG 1000ML POUR BTL (IV SOLUTION) ×3 IMPLANT
PACK CV ACCESS (CUSTOM PROCEDURE TRAY) IMPLANT
PACK GENERAL/GYN (CUSTOM PROCEDURE TRAY) ×3 IMPLANT
PACK UNIVERSAL I (CUSTOM PROCEDURE TRAY) ×3 IMPLANT
PAD ARMBOARD 7.5X6 YLW CONV (MISCELLANEOUS) ×6 IMPLANT
PULSAVAC PLUS IRRIG FAN TIP (DISPOSABLE)
SET HNDPC FAN SPRY TIP SCT (DISPOSABLE) IMPLANT
SUT ETHILON 3 0 PS 1 (SUTURE) IMPLANT
SUT VIC AB 2-0 CTX 36 (SUTURE) IMPLANT
SUT VIC AB 3-0 SH 27 (SUTURE)
SUT VIC AB 3-0 SH 27X BRD (SUTURE) IMPLANT
SUT VICRYL 4-0 PS2 18IN ABS (SUTURE) IMPLANT
TIP FAN IRRIG PULSAVAC PLUS (DISPOSABLE) IMPLANT
TOWEL GREEN STERILE (TOWEL DISPOSABLE) ×3 IMPLANT
WATER STERILE IRR 1000ML POUR (IV SOLUTION) ×3 IMPLANT

## 2020-09-07 NOTE — Anesthesia Procedure Notes (Signed)
Procedure Name: Intubation Date/Time: 09/07/2020 3:13 PM Performed by: Reece Agar, CRNA Pre-anesthesia Checklist: Patient identified, Emergency Drugs available, Suction available and Patient being monitored Patient Re-evaluated:Patient Re-evaluated prior to induction Oxygen Delivery Method: Circle System Utilized Preoxygenation: Pre-oxygenation with 100% oxygen Induction Type: IV induction Ventilation: Mask ventilation without difficulty and Oral airway inserted - appropriate to patient size Laryngoscope Size: Mac and 4 Grade View: Grade I Tube type: Oral Number of attempts: 1 Airway Equipment and Method: Stylet and Oral airway Placement Confirmation: ETT inserted through vocal cords under direct vision,  positive ETCO2 and breath sounds checked- equal and bilateral Secured at: 23 cm Tube secured with: Tape Dental Injury: Teeth and Oropharynx as per pre-operative assessment

## 2020-09-07 NOTE — H&P (Signed)
ASSESSMENT & PLAN:  79 y.o. male with bilateral lateral calf fasciotomy site dehiscence.  Likely from underlying hematoma causing pressure necrosis.  We will plan for washout and excisional debridement today in OR.  Plan for [facility] application.  Keep n.p.o.  Check Covid screen.  Admit for pain control postoperatively  CHIEF COMPLAINT:   Wounds.  HISTORY:  HISTORY OF PRESENT ILLNESS: Eddie Hodge is a 79 y.o. male well known to our service s/p bilateral lower extremity arterial thrombectomies with four compartment fasciotomies 08/22/20. He was discharged 09/02/20.  He presented to our office for evaluation.  See images from 09/05/2020 for details.  Staples were removed and local wound care initiated.  History reviewed. No pertinent past medical history.  Past Surgical History:  Procedure Laterality Date  . FASCIOTOMY Bilateral 08/21/2020   Procedure: FOUR Compartment FASCIOTOMY;  Surgeon: Marty Heck, MD;  Location: [place];  Service: Vascular;  Laterality: Bilateral;  . THROMBECTOMY FEMORAL ARTERY Bilateral 08/21/2020   Procedure: THROMBECTOMY BILATERAL FEMORAL ARTERY and Left iliac thrombectomy.;  Surgeon: Marty Heck, MD;  Location: [facility] OR;  Service: Vascular;  Laterality: Bilateral;    No family history on file.  Social History   Socioeconomic History  . Marital status: Single    Spouse name: Not on file  . Number of children: Not on file  . Years of education: Not on file  . Highest education level: Not on file  Occupational History  . Not on file  Tobacco Use  . Smoking status: Current Every Day Smoker  . Smokeless tobacco: Never Used  Substance and Sexual Activity  . Alcohol use: Never  . Drug use: Never  . Sexual activity: Not on file  Other Topics Concern  . Not on file  Social History Narrative  . Not on file   Social Determinants of Health   Financial Resource Strain: Not on file  Food Insecurity: Not on file  Transportation Needs: Not on  file  Physical Activity: Not on file  Stress: Not on file  Social Connections: Not on file  Intimate Partner Violence: Not on file    No Known Allergies  No current facility-administered medications for this encounter.   Current Outpatient Medications  Medication Sig Dispense Refill  . apixaban (ELIQUIS) 5 MG TABS tablet Take 1 tablet (5 mg total) by mouth 2 (two) times daily. 60 tablet 5  . cephALEXin (KEFLEX) 500 MG capsule Take 1 capsule (500 mg total) by mouth 4 (four) times daily for 5 days. 20 capsule 0  . collagenase (SANTYL) ointment Apply 1 application topically daily. Apply daily to leg  Wounds cover with dry dressing and guaze. 30 g 1  . folic acid (FOLVITE) 1 MG tablet Take 1 tablet (1 mg total) by mouth daily. 30 tablet 5  . oxyCODONE-acetaminophen (PERCOCET/ROXICET) 5-325 MG tablet Take 1 tablet by mouth every 6 (six) hours as needed. (Patient taking differently: Take 1 tablet by mouth every 6 (six) hours as needed for moderate pain.) 30 tablet 0  . rosuvastatin (CRESTOR) 20 MG tablet Take 1 tablet (20 mg total) by mouth daily. 30 tablet 3  . thiamine 100 MG tablet Take 1 tablet (100 mg total) by mouth daily. 30 tablet 3  . Omega-3 Fatty Acids (FISH OIL) 1000 MG CAPS Take 1,000 mg by mouth daily.      REVIEW OF SYSTEMS:  [X]  denotes positive finding, [ ]  denotes negative finding Cardiac  Comments:  Chest pain or chest pressure:  Shortness of breath upon exertion:    Short of breath when lying flat:    Irregular heart rhythm:        Vascular    Pain in calf, thigh, or hip brought on by ambulation:    Pain in feet at night that wakes you up from your sleep:     Blood clot in your veins:    Leg swelling:         Pulmonary    Oxygen at home:    Productive cough:     Wheezing:         Neurologic    Sudden weakness in arms or legs:     Sudden numbness in arms or legs:     Sudden onset of difficulty speaking or slurred speech:    Temporary loss of vision in one  eye:     Problems with dizziness:         Gastrointestinal    Blood in stool:     Vomited blood:         Genitourinary    Burning when urinating:     Blood in urine:        Psychiatric    Major depression:         Hematologic    Bleeding problems:    Problems with blood clotting too easily:        Skin    Rashes or ulcers:        Constitutional    Fever or chills:     PHYSICAL EXAM:   Vitals:   09/07/20 0645 09/07/20 0700 09/07/20 0715 09/07/20 0730  BP: 129/75 113/82 135/74 (!) 107/58  Pulse: 64 63 68 71  Resp: 15 17 15 14   Temp:      TempSrc:      SpO2: 100% 99% 100% 100%  Weight:      Height:       Constitutional: Well appearing in no distress. Appears well nourished.  Neurologic: Normal gait and station. CN intact. No weakness. No sensory loss. Psychiatric: Mood and affect symmetric and appropriate. Eyes: No icterus. No conjunctival pallor. Ears, nose, throat: mucous membranes moist. Midline trachea. No carotid bruit. Cardiac: regular rate and rhythm.  Respiratory: unlabored. Abdominal: soft, non-tender, non-distended. No palpable pulsatile abdominal mass. Peripheral vascular:  Doppler flow and bilateral PTs  Hematoma and necrosis of the bilateral lateral calf incisions Extremity: No edema. No cyanosis. No pallor.  Skin: No gangrene. No ulceration.  Lymphatic: No Stemmer's sign. No palpable lymphadenopathy.   DATA REVIEW:    Most recent CBC CBC Latest Ref Rng & Units 09/07/2020 08/30/2020 08/29/2020  WBC 4.0 - 10.5 K/uL 9.7 8.6 9.2  Hemoglobin 13.0 - 17.0 g/dL 8.8(L) 7.1(L) 7.4(L)  Hematocrit 39.0 - 52.0 % 28.4(L) 21.5(L) 22.7(L)  Platelets 150 - 400 K/uL 393 405(H) 371     Most recent CMP CMP Latest Ref Rng & Units 09/07/2020 08/28/2020 08/26/2020  Glucose 70 - 99 mg/dL 96 99 177(H)  BUN 8 - 23 mg/dL 26(H) 22 15  Creatinine 0.61 - 1.24 mg/dL 1.47(H) 1.88(H) 1.79(H)  Sodium 135 - 145 mmol/L 144 140 136  Potassium 3.5 - 5.1 mmol/L 4.9 3.6 3.3(L)   Chloride 98 - 111 mmol/L 111 107 103  CO2 22 - 32 mmol/L 26 23 22   Calcium 8.9 - 10.3 mg/dL 9.8 9.2 8.5(L)  Total Protein 6.5 - 8.1 g/dL - - -  Total Bilirubin 0.3 - 1.2 mg/dL - - -  Alkaline Phos 38 -  126 U/L - - -  AST 15 - 41 U/L - - -  ALT 0 - 44 U/L - - -    Renal function Estimated Creatinine Clearance: 41.4 mL/min (A) (by C-G formula based on SCr of 1.47 mg/dL (H)).  Hgb A1c MFr Bld (%)  Date Value  09/01/2020 5.2    LDL Cholesterol  Date Value Ref Range Status  08/22/2020 96 0 - 99 mg/dL Final    Comment:           Total Cholesterol/HDL:CHD Risk Coronary Heart Disease Risk Table                     Men   Women  1/2 Average Risk   3.4   3.3  Average Risk       5.0   4.4  2 X Average Risk   9.6   7.1  3 X Average Risk  23.4   11.0        Use the calculated Patient Ratio above and the CHD Risk Table to determine the patient's CHD Risk.        ATP III CLASSIFICATION (LDL):  <100     mg/dL   Optimal  100-129  mg/dL   Near or Above                    Optimal  130-159  mg/dL   Borderline  160-189  mg/dL   High  >190     mg/dL   Very High Performed at [facility] [address], [place], [place] [address]      [name]. Stanford Breed, MD [facility] Phone Number: [phone] 09/07/2020 7:37 AM

## 2020-09-07 NOTE — Progress Notes (Signed)
ANTICOAGULATION CONSULT NOTE - Initial Consult  Pharmacy Consult for heparin  Indication: prior arterial embolic event  No Known Allergies  Patient Measurements: Height: 6\' 3"  (190.5 cm) Weight: 73 kg (161 lb) IBW/kg (Calculated) : 84.5   Vital Signs: Temp: 98 F (36.7 C) (12/11 1807) Temp Source: Oral (12/11 1200) BP: 112/61 (12/11 1807) Pulse Rate: 62 (12/11 1807)  Labs: Recent Labs    09/07/20 0547  HGB 8.8*  HCT 28.4*  PLT 393  CREATININE 1.47*    Estimated Creatinine Clearance: 41.4 mL/min (A) (by C-G formula based on SCr of 1.47 mg/dL (H)).   Medical History: History reviewed. No pertinent past medical history.  Medications:  Medications Prior to Admission  Medication Sig Dispense Refill Last Dose  . apixaban (ELIQUIS) 5 MG TABS tablet Take 1 tablet (5 mg total) by mouth 2 (two) times daily. 60 tablet 5 09/06/2020 at 0800  . cephALEXin (KEFLEX) 500 MG capsule Take 1 capsule (500 mg total) by mouth 4 (four) times daily for 5 days. 20 capsule 0 09/06/2020 at Unknown time  . folic acid (FOLVITE) 1 MG tablet Take 1 tablet (1 mg total) by mouth daily. 30 tablet 5 09/06/2020 at Unknown time  . oxyCODONE-acetaminophen (PERCOCET/ROXICET) 5-325 MG tablet Take 1 tablet by mouth every 6 (six) hours as needed. (Patient taking differently: Take 1 tablet by mouth every 6 (six) hours as needed for moderate pain.) 30 tablet 0   . rosuvastatin (CRESTOR) 20 MG tablet Take 1 tablet (20 mg total) by mouth daily. 30 tablet 3 09/06/2020 at Unknown time  . thiamine 100 MG tablet Take 1 tablet (100 mg total) by mouth daily. 30 tablet 3 09/06/2020 at Unknown time  . Omega-3 Fatty Acids (FISH OIL) 1000 MG CAPS Take 1,000 mg by mouth daily.      Scheduled:  . pantoprazole  40 mg Oral Daily  . potassium chloride  20-40 mEq Oral Once  . rosuvastatin  20 mg Oral Daily  . thiamine  100 mg Oral Daily    Assessment: 79 yo male s/p bilateral lower extremity arterial thrombectomies with  four compartment fasciotomies 08/22/20 and with fasciotomy site dehiscence s/p OR 12/11. He is noted on apixaban at home for with last dose on 12/19 ~ 8am -start heparin with no bolus at 11pm per MD request  Goal of Therapy:  Heparin level 0.3-0.7 units/ml aPTT 66-102 seconds Monitor platelets by anticoagulation protocol: Yes   Plan:  -no heparin bolus -start heparin at 1100 units/hr at 11pm -Heparin level and aPTT in 8 hours and daily wth CBC daily  Hildred Laser, PharmD Clinical Pharmacist **Pharmacist phone directory can now be found on [email or web address] ([email or web address]).  Listed under [place].

## 2020-09-07 NOTE — Op Note (Addendum)
DATE OF SERVICE: 09/07/2020  PATIENT:  Eddie Hodge  79 y.o. male  PRE-OPERATIVE DIAGNOSIS:  Dehiscence of bilateral lateral calf fasciotomy incisions  POST-OPERATIVE DIAGNOSIS:  same  PROCEDURE:   Sharp excisional debridement of left calf wound (17x7x2cm total volume). Sharp excisional debridement of right calf wound (16x6x2cm total volume). Initiation of negative pressure therapy to bilateral lower extremity wounds.  SURGEON:  Surgeon(s) and Role:    * Humaira Sculley, [name], MD - Primary  ASSISTANT: Arlee Muslim, PA-C  An assistant was required to facilitate exposure and expedite the case.  ANESTHESIA:   general  EBL: min  BLOOD ADMINISTERED:none  DRAINS: none   LOCAL MEDICATIONS USED:  NONE  SPECIMEN:  none  COUNTS: confirmed correct.  TOURNIQUET:  * No tourniquets in log *  PATIENT DISPOSITION:  PACU - hemodynamically stable.   Delay start of Pharmacological VTE agent (>24hrs) due to surgical blood loss or risk of bleeding: no  INDICATION FOR PROCEDURE: Eddie Hodge is a 79 y.o. male with hematoma in bilateral calf fasciotomy wounds causing skin necrosis and dehiscence. After careful discussion of risks, benefits, and alternatives the patient was offered washout and excisional debridement. The patient understood and wished to proceed.  OPERATIVE FINDINGS: skin edges of lateral fasciotomy incisions necrotic. These were debrided to healthy margins with no undermining. VAC dressings applied.  DESCRIPTION OF PROCEDURE: After identification of the patient in the pre-operative holding area, the patient was transferred to the operating room. The patient was positioned supine on the operating room table. Anesthesia was induced. The bilateral legs was prepped and draped in standard fashion. A surgical pause was performed confirming correct patient, procedure, and operative location.  Both wounds were inspected. Large volume of hematoma was evacuated from both. Both edges of the  dehisced wounds were inspected and found to be necrotic. The edges were sharply debrided until healthy bleeding tissue was encountered. The muscle belies of the bilateral anterior compartments contracted when touched with bovie electrocautery. Hemostasis was achieved. A total volume 17x7x2cm was debrided from the left and 16x6x2cm was debrided from the right. VAC negative pressure dressings were applied to both wounds.  Upon completion of the case instrument and sharps counts were confirmed correct. The patient was transferred to the PACU in good condition. I was present for all portions of the procedure.  [name]. Stanford Breed, MD [facility] Phone Number: [phone] 09/07/2020 3:51 PM

## 2020-09-07 NOTE — Anesthesia Preprocedure Evaluation (Signed)
Anesthesia Evaluation  Patient identified by MRN, date of birth, ID band Patient awake    Airway Mallampati: II  TM Distance: >3 FB Neck ROM: Full    Dental  (+) Teeth Intact   Pulmonary neg pulmonary ROS, Current Smoker,    Pulmonary exam normal        Cardiovascular + Peripheral Vascular Disease   Rhythm:Regular Rate:Normal     Neuro/Psych negative neurological ROS  negative psych ROS   GI/Hepatic negative GI ROS, Neg liver ROS,   Endo/Other  negative endocrine ROS  Renal/GU negative Renal ROS  negative genitourinary   Musculoskeletal Multiple fasciotomies    Abdominal (+)  Abdomen: soft. Bowel sounds: normal.  Peds  Hematology negative hematology ROS (+)   Anesthesia Other Findings   Reproductive/Obstetrics                             Anesthesia Physical Anesthesia Plan  ASA: III  Anesthesia Plan: General   Post-op Pain Management:    Induction: Intravenous  PONV Risk Score and Plan: 1 and Ondansetron, Dexamethasone and Treatment may vary due to age or medical condition  Airway Management Planned: Mask and Oral ETT  Additional Equipment: None  Intra-op Plan:   Post-operative Plan: Extubation in OR  Informed Consent: I have reviewed the patients History and Physical, chart, labs and discussed the procedure including the risks, benefits and alternatives for the proposed anesthesia with the patient or authorized representative who has indicated his/her understanding and acceptance.     Dental advisory given  Plan Discussed with: CRNA  Anesthesia Plan Comments: (Lab Results      Component                Value               Date                      WBC                      9.7                 09/07/2020                HGB                      8.8 (L)             09/07/2020                HCT                      28.4 (L)            09/07/2020                MCV                       110.5 (H)           09/07/2020                PLT                      393                 09/07/2020  Lab Results      Component                Value               Date                      NA                       144                 09/07/2020                K                        4.9                 09/07/2020                CO2                      26                  09/07/2020                GLUCOSE                  96                  09/07/2020                BUN                      26 (H)              09/07/2020                CREATININE               1.47 (H)            09/07/2020                CALCIUM                  9.8                 09/07/2020                GFRNONAA                 48 (L)              09/07/2020                GFRAA                                        03/14/2009            >60        The eGFR has been calculated using the MDRD equation. This calculation has not been validated in all clinical situations. eGFR's persistently <60 mL/min signify possible Chronic Kidney Disease.)        Anesthesia Quick Evaluation

## 2020-09-07 NOTE — ED Notes (Signed)
Report was given to Short Stay RN for pt to go in [place], ED tech is getting pt undressed & ready before transport.

## 2020-09-07 NOTE — Transfer of Care (Signed)
Immediate Anesthesia Transfer of Care Note  Patient: Eddie Hodge  Procedure(s) Performed: EXCISION AND DEBRIDEMENT OF BILATERAL LOWER EXTREMITY WOUND (Bilateral Leg Lower) APPLICATION OF WOUND VAC (Bilateral Leg Lower)  Patient Location: PACU  Anesthesia Type:General  Level of Consciousness: awake and alert   Airway & Oxygen Therapy: Patient Spontanous Breathing and Patient connected to face mask oxygen  Post-op Assessment: Report given to RN and Post -op Vital signs reviewed and stable  Post vital signs: Reviewed and stable  Last Vitals:  Vitals Value Taken Time  BP 144/89 09/07/20 1607  Temp 36.2 C 09/07/20 1607  Pulse 87 09/07/20 1609  Resp 21 09/07/20 1609  SpO2 100 % 09/07/20 1609  Vitals shown include unvalidated device data.  Last Pain:  Vitals:   09/07/20 1607  TempSrc:   PainSc: 0-No pain         Complications: No complications documented.

## 2020-09-07 NOTE — ED Provider Notes (Signed)
[facility] EMERGENCY DEPARTMENT Provider Note   CSN: [id number] Arrival date & time: 09/06/20  1727     History Chief Complaint  Patient presents with  . Non healing surgical wound    Eddie Hodge is a 79 y.o. male.  Patient presents to the ED with a chief complaint of bleeding.  He had thrombectomies by Dr. Carlis Abbott about 3 weeks ago.  He has had some wound dehiscence, but had some bleeding bilaterally that started today.  He denies any pain.  He reports sensation in bilateral feet, but reports diminished sensation in left compared to right.  He is ambulatory.  He denies any other symptoms currently.  The history is provided by the patient. No language interpreter was used.       History reviewed. No pertinent past medical history.  Patient Active Problem List   Diagnosis Date Noted  . Lower limb ischemia 08/22/2020  . Ischemia of lower extremity 08/22/2020  . EDEMA 04/04/2009  . COLON CANCER 04/03/2009  . ALCOHOL ABUSE 04/03/2009  . ANISOCORIA 04/03/2009  . BRADYCARDIA 04/03/2009  . SUBDURAL HEMATOMA, CHRONIC 04/03/2009    Past Surgical History:  Procedure Laterality Date  . FASCIOTOMY Bilateral 08/21/2020   Procedure: FOUR Compartment FASCIOTOMY;  Surgeon: Marty Heck, MD;  Location: [place];  Service: Vascular;  Laterality: Bilateral;  . THROMBECTOMY FEMORAL ARTERY Bilateral 08/21/2020   Procedure: THROMBECTOMY BILATERAL FEMORAL ARTERY and Left iliac thrombectomy.;  Surgeon: Marty Heck, MD;  Location[facility] OR;  Service: Vascular;  Laterality: Bilateral;       No family history on file.  Social History   Tobacco Use  . Smoking status: Current Every Day Smoker  . Smokeless tobacco: Never Used  Substance Use Topics  . Alcohol use: Never  . Drug use: Never    Home Medications Prior to Admission medications   Medication Sig Start Date End Date Taking? Authorizing Provider  apixaban (ELIQUIS) 5 MG TABS tablet Take 1 tablet  (5 mg total) by mouth 2 (two) times daily. 08/26/20   Ulyses Amor, PA-C  cephALEXin (KEFLEX) 500 MG capsule Take 1 capsule (500 mg total) by mouth 4 (four) times daily for 5 days. 09/02/20 09/07/20  Baglia, Corrina, PA-C  collagenase (SANTYL) ointment Apply 1 application topically daily. Apply daily to leg  Wounds cover with dry dressing and guaze. 09/05/20   Ulyses Amor, PA-C  folic acid (FOLVITE) 1 MG tablet Take 1 tablet (1 mg total) by mouth daily. 08/27/20   Ulyses Amor, PA-C  Omega-3 Fatty Acids (FISH OIL) 1000 MG CAPS Take 1,000 mg by mouth daily.    [provider]  oxyCODONE-acetaminophen (PERCOCET/ROXICET) 5-325 MG tablet Take 1 tablet by mouth every 6 (six) hours as needed. 09/05/20   Ulyses Amor, PA-C  rosuvastatin (CRESTOR) 20 MG tablet Take 1 tablet (20 mg total) by mouth daily. 08/28/20   Ulyses Amor, PA-C  thiamine 100 MG tablet Take 1 tablet (100 mg total) by mouth daily. 08/27/20   Ulyses Amor, PA-C    Allergies    Patient has no known allergies.  Review of Systems   Review of Systems  All other systems reviewed and are negative.   Physical Exam Updated Vital Signs BP 118/65   Pulse 67   Temp 98.9 F (37.2 C) (Oral)   Resp 18   Ht 6\' 3"  (1.905 m)   Wt 73 kg   SpO2 100%   BMI 20.12 kg/m  Physical Exam Vitals and nursing note reviewed.  Constitutional:      General: He is not in acute distress.    Appearance: He is well-developed. He is not ill-appearing.  HENT:     Head: Normocephalic and atraumatic.  Eyes:     Conjunctiva/sclera: Conjunctivae normal.  Cardiovascular:     Rate and Rhythm: Normal rate.     Comments: Distal DP and PT pulses are dopplerable but not palpated Pulmonary:     Effort: Pulmonary effort is normal. No respiratory distress.  Abdominal:     General: There is no distension.  Musculoskeletal:     Cervical back: Neck supple.     Comments: Moves all extremities  Skin:    General: Skin is warm and dry.      Comments: Wound dehiscence as pictured No active bleeding No sign of infection  Neurological:     Mental Status: He is alert and oriented to person, place, and time.  Psychiatric:        Mood and Affect: Mood normal.        Behavior: Behavior normal.             ED Results / Procedures / Treatments   Labs (all labs ordered are listed, but only abnormal results are displayed) Labs Reviewed - No data to display  EKG None  Radiology DG Tibia/Fibula Right  Result Date: 09/06/2020 CLINICAL DATA:  Surgical wound dehiscence EXAM: RIGHT TIBIA AND FIBULA - 2 VIEW COMPARISON:  None. FINDINGS: Skin staples are noted in the medial and lateral soft tissues of the right calf. Probable wound dehiscence noted laterally. No acute bony abnormality. Specifically, no fracture, subluxation, or dislocation. IMPRESSION: Skin staples noted medially and laterally. No acute bony abnormality. Electronically Signed   By: Rolm Baptise M.D.   On: 09/06/2020 21:22    Procedures Procedures (including critical care time)  Medications Ordered in ED Medications - No data to display  ED Course  I have reviewed the triage vital signs and the nursing notes.  Pertinent labs & imaging results that were available during my care of the patient were reviewed by me and considered in my medical decision making (see chart for details).    MDM Rules/Calculators/A&P                          Patient here with recent thrombectomies in bilateral lower extremities now having complications with wound healing and wound dehiscence.  He is not actively bleeding.  He does have dopplerable distal pulses in bilateral lower extremities.  He does have sensation.  The wounds look significantly worse from previous pictures in the chart.  I discussed case with Dr. Sherry Ruffing, who recommends consultation with vascular surgery.  I appreciate Dr. Stanford Breed, who will come to evaluate the patient in the ER.  Anticipate admission and  possible OR.  Patient will be kept NPO. Final Clinical Impression(s) / ED Diagnoses Final diagnoses:  Wound dehiscence    Rx / DC Orders ED Discharge Orders    None       Montine Circle, PA-C 09/07/20 6811    Tegeler, Gwenyth Allegra, MD 09/07/20 2321

## 2020-09-08 LAB — CBC
HCT: 24 % — ABNORMAL LOW (ref 39.0–52.0)
Hemoglobin: 7.5 g/dL — ABNORMAL LOW (ref 13.0–17.0)
MCH: 33.8 pg (ref 26.0–34.0)
MCHC: 31.3 g/dL (ref 30.0–36.0)
MCV: 108.1 fL — ABNORMAL HIGH (ref 80.0–100.0)
Platelets: 310 10*3/uL (ref 150–400)
RBC: 2.22 MIL/uL — ABNORMAL LOW (ref 4.22–5.81)
RDW: 15.5 % (ref 11.5–15.5)
WBC: 6.4 10*3/uL (ref 4.0–10.5)
nRBC: 0 % (ref 0.0–0.2)

## 2020-09-08 LAB — BASIC METABOLIC PANEL
Anion gap: 9 (ref 5–15)
BUN: 28 mg/dL — ABNORMAL HIGH (ref 8–23)
CO2: 19 mmol/L — ABNORMAL LOW (ref 22–32)
Calcium: 8.7 mg/dL — ABNORMAL LOW (ref 8.9–10.3)
Chloride: 113 mmol/L — ABNORMAL HIGH (ref 98–111)
Creatinine, Ser: 1.64 mg/dL — ABNORMAL HIGH (ref 0.61–1.24)
GFR, Estimated: 42 mL/min — ABNORMAL LOW (ref >60)
Glucose, Bld: 110 mg/dL — ABNORMAL HIGH (ref 70–99)
Potassium: 5.5 mmol/L — ABNORMAL HIGH (ref 3.5–5.1)
Sodium: 141 mmol/L (ref 135–145)

## 2020-09-08 LAB — APTT: aPTT: 57 seconds — ABNORMAL HIGH (ref 24–36)

## 2020-09-08 LAB — HEPARIN LEVEL (UNFRACTIONATED): Heparin Unfractionated: 0.64 IU/mL (ref 0.30–0.70)

## 2020-09-08 MED ORDER — APIXABAN 5 MG PO TABS
5.0000 mg | ORAL_TABLET | Freq: Two times a day (BID) | ORAL | Status: DC
  Administered 2020-09-08 – 2020-09-12 (×9): 5 mg via ORAL
  Filled 2020-09-08 (×9): qty 1

## 2020-09-08 NOTE — Progress Notes (Addendum)
[place] for heparin to Eliquis Indication: prior arterial embolic event  No Known Allergies  Patient Measurements: Height: 6\' 3"  (190.5 cm) Weight: 73 kg (161 lb) IBW/kg (Calculated) : 84.5   Vital Signs: Temp: 98 F (36.7 C) (12/12 0425) Temp Source: Oral (12/12 0425) BP: 101/54 (12/12 0425) Pulse Rate: 80 (12/12 0425)  Labs: Recent Labs    09/07/20 0547 09/08/20 0014  HGB 8.8* 7.5*  HCT 28.4* 24.0*  PLT 393 310  CREATININE 1.47*  --     Estimated Creatinine Clearance: 41.4 mL/min (A) (by C-G formula based on SCr of 1.47 mg/dL (H)).   Medical History: History reviewed. No pertinent past medical history.  Medications:  Medications Prior to Admission  Medication Sig Dispense Refill Last Dose  . apixaban (ELIQUIS) 5 MG TABS tablet Take 1 tablet (5 mg total) by mouth 2 (two) times daily. 60 tablet 5 09/06/2020 at 0800  . [EXPIRED] cephALEXin (KEFLEX) 500 MG capsule Take 1 capsule (500 mg total) by mouth 4 (four) times daily for 5 days. 20 capsule 0 09/06/2020 at Unknown time  . folic acid (FOLVITE) 1 MG tablet Take 1 tablet (1 mg total) by mouth daily. 30 tablet 5 09/06/2020 at Unknown time  . oxyCODONE-acetaminophen (PERCOCET/ROXICET) 5-325 MG tablet Take 1 tablet by mouth every 6 (six) hours as needed. (Patient taking differently: Take 1 tablet by mouth every 6 (six) hours as needed for moderate pain.) 30 tablet 0   . rosuvastatin (CRESTOR) 20 MG tablet Take 1 tablet (20 mg total) by mouth daily. 30 tablet 3 09/06/2020 at Unknown time  . thiamine 100 MG tablet Take 1 tablet (100 mg total) by mouth daily. 30 tablet 3 09/06/2020 at Unknown time  . Omega-3 Fatty Acids (FISH OIL) 1000 MG CAPS Take 1,000 mg by mouth daily.      Scheduled:  . pantoprazole  40 mg Oral Daily  . potassium chloride  20-40 mEq Oral Once  . rosuvastatin  20 mg Oral q1800  . thiamine  100 mg Oral Daily    Assessment: 78 yo male s/p bilateral lower  extremity arterial thrombectomies with four compartment fasciotomies 08/22/20 and with fasciotomy site dehiscence s/p OR 12/11. He is noted on apixaban at home for with last dose on 12/19 ~ 8am. Pharmacy to dose heparin with no bolus.  APTT is subtherapeutic at 57 sec on 1100 units/hr. Eliquis still affecting heparin level.   Goal of Therapy:  Heparin level 0.3-0.7 units/ml aPTT 66-102 seconds Monitor platelets by anticoagulation protocol: Yes   Plan:  Increase rate to 1250 units/hr Heparin level and aPTT in 8 hours and daily wth CBC daily  Addendum: Pharmacy has been consulted to switch to Eliquis.  - Stop heparin, Resume Eliquis 5mg  BID   Romilda Garret, PharmD PGY1 Acute Care Pharmacy Resident Phone: [phone] 09/08/2020 8:18 AM  Please check [email or web address] for unit specific pharmacy phone numbers.

## 2020-09-08 NOTE — Anesthesia Postprocedure Evaluation (Signed)
Anesthesia Post Note  Patient: Eddie Hodge  Procedure(s) Performed: EXCISION AND DEBRIDEMENT OF BILATERAL LOWER EXTREMITY WOUND (Bilateral Leg Lower) APPLICATION OF WOUND VAC (Bilateral Leg Lower)     Patient location during evaluation: PACU Anesthesia Type: General Level of consciousness: awake and alert Pain management: pain level controlled Vital Signs Assessment: post-procedure vital signs reviewed and stable Respiratory status: spontaneous breathing, nonlabored ventilation, respiratory function stable and patient connected to nasal cannula oxygen Cardiovascular status: blood pressure returned to baseline and stable Postop Assessment: no apparent nausea or vomiting Anesthetic complications: no   No complications documented.  Last Vitals:  Vitals:   09/08/20 0425 09/08/20 0826  BP: (!) 101/54 (!) 93/55  Pulse: 80 62  Resp: 18 18  Temp: 36.7 C 36.7 C  SpO2: 100% 99%    Last Pain:  Vitals:   09/08/20 0826  TempSrc: Oral  PainSc:                  March Rummage Aki Abalos

## 2020-09-08 NOTE — Discharge Instructions (Addendum)
Information on my medicine - ELIQUIS (apixaban)  This medication education was reviewed with me or my healthcare representative as part of my discharge preparation.    Why was Eliquis prescribed for you? Eliquis was prescribed for you to reduce the risk of forming blood clots that can cause a stroke if you have a medical condition called atrial fibrillation (a type of irregular heartbeat) OR to reduce the risk of a blood clots forming after orthopedic surgery.  What do You need to know about Eliquis ? Take your Eliquis TWICE DAILY - one tablet in the morning and one tablet in the evening with or without food.  It would be best to take the doses about the same time each day.  If you have difficulty swallowing the tablet whole please discuss with your pharmacist how to take the medication safely.  Take Eliquis exactly as prescribed by your doctor and DO NOT stop taking Eliquis without talking to the doctor who prescribed the medication.  Stopping may increase your risk of developing a new clot or stroke.  Refill your prescription before you run out.  After discharge, you should have regular check-up appointments with your healthcare provider that is prescribing your Eliquis.  In the future your dose may need to be changed if your kidney function or weight changes by a significant amount or as you get older.  What do you do if you miss a dose? If you miss a dose, take it as soon as you remember on the same day and resume taking twice daily.  Do not take more than one dose of ELIQUIS at the same time.  Important Safety Information A possible side effect of Eliquis is bleeding. You should call your healthcare provider right away if you experience any of the following: ? Bleeding from an injury or your nose that does not stop. ? Unusual colored urine (red or dark brown) or unusual colored stools (red or black). ? Unusual bruising for unknown reasons. ? A serious fall or if you hit your  head (even if there is no bleeding).  Some medicines may interact with Eliquis and might increase your risk of bleeding or clotting while on Eliquis. To help avoid this, consult your healthcare provider or pharmacist prior to using any new prescription or non-prescription medications, including herbals, vitamins, non-steroidal anti-inflammatory drugs (NSAIDs) and supplements.  This website has more information on Eliquis (apixaban): [email or web address].no.   Negative Pressure Wound Therapy Home Guide Negative pressure wound therapy (NPWT) uses a sponge or foam-like material (dressing) placed on or inside the wound. The wound is then covered and sealed with a cover dressing that sticks to your skin (is adhesive). This keeps air out. A tube is attached to the cover dressing, and this tube connects to a small pump. The pump sucks fluid and germs from the wound. NPWT helps to increase blood flow to the wound and heal it from the inside. What are the risks? NPWT is usually safe to use. However, problems can occur, including:  Skin irritation from the dressing adhesive.  Bleeding.  Infection.  Dehydration. Wounds with large amounts of drainage can cause excessive fluid loss.  Pain. Supplies needed:  A disposable garbage bag.  Soap and water, or hand sanitizer.  Wound cleanser or salt-water solution (saline).  New sponge and cover dressing.  Protective clothing.  Gauze pad.  Vinyl gloves.  Tape.  Skin protectant. This may be a wipe, film, or spray.  Clean or germ-free (sterile) scissors.  Eye  protection. How to change your dressing Prepare to change your dressing  1. If told by your health care provider, take pain medicine 30 minutes before changing the dressing. 2. Wash your hands with soap and water. Dry your hands with a clean towel. If soap and water are not available, use hand sanitizer. 3. Set up a clean station for wound care. 4. Open the dressing package so that the  sponge dressing remains on the inside of the package. 5. Wear gloves, protective clothing, and eye protection. Remove old dressing  1. Turn off the pump and disconnect the tubing from the dressing. 2. Carefully remove the adhesive cover dressing in the direction of your hair growth. 3. Remove the sponge dressing that is inside the wound. If the sponge sticks, use a wound cleanser or saline solution to wet the sponge and help it come off more easily. 4. Throw the old sponge and cover dressing supplies into the garbage bag. 5. Remove your gloves by grabbing the cuff and turning the glove inside out. Place the gloves in the trash immediately. 6. Wash your hands with soap and water. Dry your hands with a clean towel. If soap and water are not available, use hand sanitizer. Clean your wound  Wear gloves, protective clothing, and eye protection. Follow your health care provider's instructions on how to clean your wound. You may be told to: 1. Clean the wound using a saline solution or a wound cleanser and a clean gauze pad. 2. Pat the wound dry with a gauze pad. Do not rub the wound. 3. Throw the gauze pad into the garbage bag. 4. Remove your gloves by grabbing the cuff and turning the glove inside out. Place the gloves in the trash immediately. 5. Wash your hands with soap and water. Dry your hands with a clean towel. If soap and water are not available, use hand sanitizer. Apply new dressing  Wear gloves, protective clothing, and eye protection. 1. If told by your health care provider, apply a skin protectant to any skin that will be exposed to adhesive. Let the skin protectant dry. 2. Cut a piece of new sponge dressing and put it on or in the wound. 3. Using clean scissors, cut a nickel-sized hole in the new cover dressing. 4. Apply the cover dressing. 5. Attach the suction tube over the hole in the cover dressing. 6. Take off your gloves. Put them in the plastic bag with the old dressing.  Tie the bag shut and throw it away. 7. Wash your hands with soap and water. Dry your hands with a clean towel. If soap and water are not available, use hand sanitizer. 8. Turn the pump back on. The sponge dressing should collapse. Do not change the settings on the machine without talking to a health care provider. 9. Replace the container in the pump that collects fluid if it is full. Replace the container per the manufacturer's instructions or at least once a week, even if it is not full. General tips and recommendations If the alarm sounds:  Stay calm.  Do not turn off the pump or do anything with the dressing.  Reasons the alarm may go off: ? The battery is low. Change the battery or plug the device into electrical power. ? The dressing has a leak. Find the leak and put tape over the leak. ? The fluid collection container is full. Change the fluid container.  Call your health care provider right away if you cannot fix the  problem.  Explain to your health care provider what is happening. Follow his or her instructions. General instructions  Do not turn off the pump unless told to do so by your health care provider.  Do not turn off the pump for more than 2 hours. If the pump is off for more than 2 hours, the dressing will need to be changed.  If your health care provider says it is okay to shower: ? Do not take the pump into the shower. ? Make sure the wound dressing is protected and sealed. The wound dressing must stay dry.  Check frequently that the machine indicates that therapy is on and that all clamps are open.  Do not use over-the-counter medicated or antiseptic creams, sprays, liquids, or dressings unless your health care provider approves. Contact a health care provider if:  You have new pain.  You develop irritation, a rash, or itching around the wound or dressing.  You see new black or yellow tissue in your wound.  The dressing changes are painful or cause  bleeding.  The pump has been off for more than 2 hours, and you do not know how to change the dressing.  The pump alarm goes off, and you do not know what to do. Get help right away if:  You have a lot of bleeding.  The wound breaks open.  You have severe pain.  You have signs of infection, such as: ? More redness, swelling, or pain. ? More fluid or blood. ? Warmth. ? Pus or a bad smell. ? Red streaks leading from the wound. ? A fever.  You see a sudden change in the color or texture of the drainage.  You have signs of dehydration, such as: ? Little or no tears, urine, or sweat. ? Muscle cramps. ? Very dry mouth. ? Headache. ? Dizziness. Summary  Negative pressure wound therapy (NPWT) is a device that helps your wound heal.  Set up a clean station for wound care. Your health care provider will tell you what supplies to use.  Follow your health care provider's instructions on how to clean your wound and how to change the dressing.  Contact a health care provider if you have new pain, an irritation, or a rash, or if the alarm goes off and you do not know what to do.  Get help right away if you have a lot of bleeding, your wound breaks open, or you have severe pain. Also, get help if you have signs of infection. This information is not intended to replace advice given to you by your health care provider. Make sure you discuss any questions you have with your health care provider. Document Revised: 01/06/2019 Document Reviewed: 12/02/2018 Elsevier Patient Education  [place].

## 2020-09-08 NOTE — Plan of Care (Signed)

## 2020-09-08 NOTE — Progress Notes (Addendum)
  Progress Note    09/08/2020 8:22 AM 1 Day Post-Op  Subjective:  No complaints today   Vitals:   09/07/20 2309 09/08/20 0425  BP: (!) 113/54 (!) 101/54  Pulse:  80  Resp: 15 18  Temp: 98.9 F (37.2 C) 98 F (36.7 C)  SpO2: 99% 100%   Physical Exam: Lungs:  Non labored Incisions:  Medial fasciotomy incisions with staples in place; lateral fasciotomy incisions with wound vac, good seal, 150cc sanguinous collection in cannister Extremities:  DP and PT signals by doppler Neurologic: A&O  CBC    Component Value Date/Time   WBC 6.4 09/08/2020 0014   RBC 2.22 (L) 09/08/2020 0014   HGB 7.5 (L) 09/08/2020 0014   HGB 12.9 (L) 03/08/2008 1012   HCT 24.0 (L) 09/08/2020 0014   HCT 39.4 03/08/2008 1012   PLT 310 09/08/2020 0014   PLT 222 03/08/2008 1012   MCV 108.1 (H) 09/08/2020 0014   MCV 97.7 03/08/2008 1012   MCH 33.8 09/08/2020 0014   MCHC 31.3 09/08/2020 0014   RDW 15.5 09/08/2020 0014   RDW 12.4 03/08/2008 1012   LYMPHSABS 1.3 08/29/2020 0230   LYMPHSABS 2.0 03/08/2008 1012   MONOABS 1.2 (H) 08/29/2020 0230   MONOABS 0.5 03/08/2008 1012   EOSABS 0.2 08/29/2020 0230   EOSABS 0.2 03/08/2008 1012   BASOSABS 0.0 08/29/2020 0230   BASOSABS 0.1 03/08/2008 1012    BMET    Component Value Date/Time   NA 144 09/07/2020 0547   K 4.9 09/07/2020 0547   CL 111 09/07/2020 0547   CO2 26 09/07/2020 0547   GLUCOSE 96 09/07/2020 0547   BUN 26 (H) 09/07/2020 0547   CREATININE 1.47 (H) 09/07/2020 0547   CALCIUM 9.8 09/07/2020 0547   GFRNONAA 48 (L) 09/07/2020 0547   GFRAA  03/14/2009 0625    >60        The eGFR has been calculated using the MDRD equation. This calculation has not been validated in all clinical situations. eGFR's persistently <60 mL/min signify possible Chronic Kidney Disease.    INR    Component Value Date/Time   INR 1.1 08/21/2020 1821     Intake/Output Summary (Last 24 hours) at 09/08/2020 1610 Last data filed at 09/08/2020 0747 Gross  per 24 hour  Intake 1978.9 ml  Output 795 ml  Net 1183.9 ml     Assessment/Plan:  79 y.o. male is s/p BLE thrombectomy and fasciotomy with post op hematoma and skin necrosis 1 Day Post-Op   BLE well perfused with DP and PTa signals by doppler bilaterally Continue wound vac dressing; change tomorrow Continue heparin Will likely need placement; PT/OT/TOC consulted   Dagoberto Ligas, PA-C [facility] [phone] 09/08/2020 8:22 AM  VASCULAR STAFF ADDENDUM: I have independently interviewed and examined the patient. I agree with the above.  VAC to lateral legs bilaterally with good seal. Transition to [place]. May be ready for discharge if can tolerate VAC change at bedside tomorrow.  [name]. Stanford Breed, MD [facility] Phone Number: [phone] 09/08/2020 11:09 AM

## 2020-09-08 NOTE — Evaluation (Signed)
Physical Therapy Evaluation Patient Details Name: Eddie Hodge MRN: [id number] DOB: 1941-03-31 Today's Date: 09/08/2020   History of Present Illness  Pt adm with bilateral calf fasciotomy site dehiscence likely from underlying hematoma. Underwent debridement of bilateral calf wounds and VAC placement on 09/07/20. PMH - s/p bilateral lower extremity arterial thrombectomies with four compartment fasciotomies 08/22/20 (he was discharged 09/02/20), colon CA, etoh abuse.  Clinical Impression  Pt admitted with above diagnosis and presents to PT with functional limitations due to deficits listed below (See PT problem list). Pt needs skilled PT to maximize independence and safety to allow discharge to home with family. Doing well post-op.      Follow Up Recommendations Home health PT;Supervision - Intermittent    Equipment Recommendations  None recommended by PT    Recommendations for Other Services       Precautions / Restrictions Precautions Precautions: Other (comment);Fall Precaution Comments: VAC      Mobility  Bed Mobility Overal bed mobility: Modified Independent                  Transfers Overall transfer level: Needs assistance Equipment used: Rolling walker (2 wheeled) Transfers: Sit to/from Stand Sit to Stand: Min assist         General transfer comment: Assist to bring hips up and to steady  Ambulation/Gait Ambulation/Gait assistance: Min guard Gait Distance (Feet): 200 Feet Assistive device: Rolling walker (2 wheeled) Gait Pattern/deviations: Step-through pattern;Decreased stride length Gait velocity: normal Gait velocity interpretation: >2.62 ft/sec, indicative of community ambulatory General Gait Details: Assist for lines/safety  Stairs            Wheelchair Mobility    Modified Rankin (Stroke Patients Only)       Balance Overall balance assessment: Needs assistance Sitting-balance support: No upper extremity supported;Feet  supported Sitting balance-Leahy Scale: Good     Standing balance support: Bilateral upper extremity supported Standing balance-Leahy Scale: Poor Standing balance comment: walker and min guard for static standing                             Pertinent Vitals/Pain Pain Assessment: No/denies pain    Home Living Family/patient expects to be discharged to:: Private residence Living Arrangements: Children Available Help at Discharge: Family;Available 24 hours/day Type of Home: House Home Access: Stairs to enter   [employer] of Steps: 2 Home Layout: One level Home Equipment: Walker - 2 wheels;Bedside commode;Hand held shower head;Grab bars - tub/shower      Prior Function Level of Independence: Needs assistance   Gait / Transfers Assistance Needed: Amb modified independent with rolling walker           Hand Dominance   Dominant Hand: Right    Extremity/Trunk Assessment   Upper Extremity Assessment Upper Extremity Assessment: Defer to OT evaluation    Lower Extremity Assessment Lower Extremity Assessment: Generalized weakness       Communication   Communication: HOH  Cognition Arousal/Alertness: Awake/alert Behavior During Therapy: WFL for tasks assessed/performed Overall Cognitive Status: Within Functional Limits for tasks assessed                                        General Comments      Exercises     Assessment/Plan    PT Assessment Patient needs continued PT services  PT Problem List Decreased strength;Decreased  activity tolerance;Decreased balance;Decreased mobility       PT Treatment Interventions DME instruction;Gait training;Functional mobility training;Therapeutic activities;Therapeutic exercise;Balance training;Patient/family education    PT Goals (Current goals can be found in the Care Plan section)  Acute Rehab PT Goals Patient Stated Goal: return home PT Goal Formulation: With patient Time For  Goal Achievement: 09/15/20 Potential to Achieve Goals: Good    Frequency Min 3X/week   Barriers to discharge        Co-evaluation               AM-PAC PT "6 Clicks" Mobility  Outcome Measure Help needed turning from your back to your side while in a flat bed without using bedrails?: None Help needed moving from lying on your back to sitting on the side of a flat bed without using bedrails?: None Help needed moving to and from a bed to a chair (including a wheelchair)?: A Little Help needed standing up from a chair using your arms (e.g., wheelchair or bedside chair)?: A Little Help needed to walk in hospital room?: A Little Help needed climbing 3-5 steps with a railing? : A Little 6 Click Score: 20    End of Session Equipment Utilized During Treatment: Gait belt Activity Tolerance: Patient tolerated treatment well Patient left: in chair;with call bell/phone within reach;with nursing/sitter in room Nurse Communication: Mobility status PT Visit Diagnosis: Other abnormalities of gait and mobility (R26.89);Muscle weakness (generalized) (M62.81)    Time: 7867-5449 PT Time Calculation (min) (ACUTE ONLY): 21 min   Charges:   PT Evaluation $PT Eval Moderate Complexity: [place] Pager [phone] Office [place] 09/08/2020, 3:59 PM

## 2020-09-09 ENCOUNTER — Ambulatory Visit: Payer: Medicare Other

## 2020-09-09 ENCOUNTER — Encounter (HOSPITAL_COMMUNITY): Payer: Self-pay | Admitting: Vascular Surgery

## 2020-09-09 LAB — CBC
HCT: 23.4 % — ABNORMAL LOW (ref 39.0–52.0)
Hemoglobin: 7.1 g/dL — ABNORMAL LOW (ref 13.0–17.0)
MCH: 33.3 pg (ref 26.0–34.0)
MCHC: 30.3 g/dL (ref 30.0–36.0)
MCV: 109.9 fL — ABNORMAL HIGH (ref 80.0–100.0)
Platelets: 257 10*3/uL (ref 150–400)
RBC: 2.13 MIL/uL — ABNORMAL LOW (ref 4.22–5.81)
RDW: 15.8 % — ABNORMAL HIGH (ref 11.5–15.5)
WBC: 8.1 10*3/uL (ref 4.0–10.5)
nRBC: 0 % (ref 0.0–0.2)

## 2020-09-09 LAB — BASIC METABOLIC PANEL
Anion gap: 6 (ref 5–15)
BUN: 27 mg/dL — ABNORMAL HIGH (ref 8–23)
CO2: 23 mmol/L (ref 22–32)
Calcium: 9 mg/dL (ref 8.9–10.3)
Chloride: 114 mmol/L — ABNORMAL HIGH (ref 98–111)
Creatinine, Ser: 1.64 mg/dL — ABNORMAL HIGH (ref 0.61–1.24)
GFR, Estimated: 42 mL/min — ABNORMAL LOW (ref >60)
Glucose, Bld: 94 mg/dL (ref 70–99)
Potassium: 4.5 mmol/L (ref 3.5–5.1)
Sodium: 143 mmol/L (ref 135–145)

## 2020-09-09 NOTE — Progress Notes (Addendum)
Progress Note    09/09/2020 7:27 AM 2 Days Post-Op  Subjective:  No complaints   Vitals:   09/08/20 2345 09/09/20 0320  BP: (!) 109/53 (!) 106/56  Pulse: 85 79  Resp: 19 20  Temp: 99 F (37.2 C) 98.3 F (36.8 C)  SpO2: 94% 96%    Physical Exam: Cardiac:  RRR Lungs:  CTAB Incisions:  LE medial incisions well approximated.  Extremities:  Wound VAC sponges removed. Healthy appearing muscle beds bilaterally + bilateral DP Doppler signals  CBC    Component Value Date/Time   WBC 6.4 09/08/2020 0014   RBC 2.22 (L) 09/08/2020 0014   HGB 7.5 (L) 09/08/2020 0014   HGB 12.9 (L) 03/08/2008 1012   HCT 24.0 (L) 09/08/2020 0014   HCT 39.4 03/08/2008 1012   PLT 310 09/08/2020 0014   PLT 222 03/08/2008 1012   MCV 108.1 (H) 09/08/2020 0014   MCV 97.7 03/08/2008 1012   MCH 33.8 09/08/2020 0014   MCHC 31.3 09/08/2020 0014   RDW 15.5 09/08/2020 0014   RDW 12.4 03/08/2008 1012   LYMPHSABS 1.3 08/29/2020 0230   LYMPHSABS 2.0 03/08/2008 1012   MONOABS 1.2 (H) 08/29/2020 0230   MONOABS 0.5 03/08/2008 1012   EOSABS 0.2 08/29/2020 0230   EOSABS 0.2 03/08/2008 1012   BASOSABS 0.0 08/29/2020 0230   BASOSABS 0.1 03/08/2008 1012    BMET    Component Value Date/Time   NA 141 09/08/2020 0732   K 5.5 (H) 09/08/2020 0732   CL 113 (H) 09/08/2020 0732   CO2 19 (L) 09/08/2020 0732   GLUCOSE 110 (H) 09/08/2020 0732   BUN 28 (H) 09/08/2020 0732   CREATININE 1.64 (H) 09/08/2020 0732   CALCIUM 8.7 (L) 09/08/2020 0732   GFRNONAA 42 (L) 09/08/2020 0732   GFRAA  03/14/2009 0625    >60        The eGFR has been calculated using the MDRD equation. This calculation has not been validated in all clinical situations. eGFR's persistently <60 mL/min signify possible Chronic Kidney Disease.     Intake/Output Summary (Last 24 hours) at 09/09/2020 0727 Last data filed at 09/09/2020 0700 Gross per 24 hour  Intake 480 ml  Output 950 ml  Net -470 ml    HOSPITAL MEDICATIONS Scheduled  Meds: . apixaban  5 mg Oral BID  . pantoprazole  40 mg Oral Daily  . potassium chloride  20-40 mEq Oral Once  . rosuvastatin  20 mg Oral q1800  . thiamine  100 mg Oral Daily   Continuous Infusions: PRN Meds:.acetaminophen **OR** acetaminophen, alum & mag hydroxide-simeth, guaiFENesin-dextromethorphan, hydrALAZINE, labetalol, metoprolol tartrate, morphine injection, ondansetron, oxyCODONE-acetaminophen, phenol  Assessment: 79 yo s/p sharp excisional debridement of bilateral lateral fasiciotomy sites POD 2. VSS. Afebrile.  Plan: -Consult wound care RN to replace VAC sponges.  Saline W>>D dressings applied -Follow-up PT/OT eval regarding possible SNF placement -AM labs pending -DVT prophylaxis:  On apixiban  Addendum: Na and K are WNL. Hgb 7.1.  Serum Cr 1.67 Risa Grill, PA-C [facility] [phone] 09/09/2020  7:27 AM   I have seen and evaluated the patient. I agree with the PA note as documented above.  VAC changed today to lateral fasciotomy wounds and appreciate wound care.  Assuming he tolerates this will be close to discharge.  Will get PT OT to evaluate home versus SNF.  Restart eliquis given recent thromboembolic event.  Brisk doppler signals in feet.  Marty Heck, MD [facility]  [place] Office: [phone]

## 2020-09-09 NOTE — Evaluation (Signed)
Occupational Therapy Evaluation Patient Details Name: Eddie Hodge MRN: [id number] DOB: 1940-12-02 Today's Date: 09/09/2020    History of Present Illness Pt adm with bilateral calf fasciotomy site dehiscence likely from underlying hematoma. Underwent debridement of bilateral calf wounds and VAC placement on 09/07/20. PMH - s/p bilateral lower extremity arterial thrombectomies with four compartment fasciotomies 08/22/20 (he was discharged 09/02/20), colon CA, etoh abuse.   Clinical Impression   Pt was ambulating with RW and completing self care modified independently prior to this admission. He denies pain this session, but presents with generalized weakness and impaired standing balance. He requires set up to min assist for ADL. Will follow acutely. Recommend home with his family and [place] upon discharge.     Follow Up Recommendations  Home health OT    Equipment Recommendations  None recommended by OT    Recommendations for Other Services       Precautions / Restrictions Precautions Precautions: Fall;Other (comment) Precaution Comments: B LE wound vac Restrictions Weight Bearing Restrictions: No      Mobility Bed Mobility Overal bed mobility: Modified Independent                  Transfers Overall transfer level: Needs assistance Equipment used: Rolling walker (2 wheeled) Transfers: Sit to/from Stand Sit to Stand: Min assist         General transfer comment: assist to rise and steady    Balance Overall balance assessment: Needs assistance   Sitting balance-Leahy Scale: Good       Standing balance-Leahy Scale: Poor Standing balance comment: min assist without B UE support                           ADL either performed or assessed with clinical judgement   ADL Overall ADL's : Needs assistance/impaired Eating/Feeding: Independent;Bed level   Grooming: Wash/dry hands;Min guard;Standing   Upper Body Bathing: Set up;Sitting   Lower Body  Bathing: Minimal assistance;Sit to/from stand   Upper Body Dressing : Set up;Sitting   Lower Body Dressing: Minimal assistance;Sit to/from stand   Toilet Transfer: Min guard;Ambulation;RW   Toileting- Water quality scientist and Hygiene: Min guard;Sit to/from stand       Functional mobility during ADLs: Min guard;Rolling walker General ADL Comments: assist for standing balance to complete LB dressing, pt can access his feet in sitting without difficulty     Vision Baseline Vision/History: Wears glasses Patient Visual Report: No change from baseline       Perception     Praxis      Pertinent Vitals/Pain Pain Assessment: No/denies pain     Hand Dominance Right   Extremity/Trunk Assessment Upper Extremity Assessment Upper Extremity Assessment: Overall WFL for tasks assessed   Lower Extremity Assessment Lower Extremity Assessment: Defer to PT evaluation   Cervical / Trunk Assessment Cervical / Trunk Assessment: Normal   Communication Communication Communication: HOH   Cognition Arousal/Alertness: Awake/alert Behavior During Therapy: WFL for tasks assessed/performed Overall Cognitive Status: Within Functional Limits for tasks assessed                                     General Comments       Exercises     Shoulder Instructions      Home Living Family/patient expects to be discharged to:: Private residence Living Arrangements: Children Available Help at Discharge: Family;Available 24 hours/day Type of  Home: House Home Access: Stairs to enter [employer] of Steps: 2   Home Layout: One level     Bathroom Shower/Tub: Teacher, early years/pre: Handicapped height     Home Equipment: Environmental consultant - 2 wheels;Bedside commode;Hand held shower head;Grab bars - tub/shower          Prior Functioning/Environment Level of Independence: Needs assistance  Gait / Transfers Assistance Needed: Amb modified independent with rolling  walker ADL's / Homemaking Assistance Needed: independent in self care, family helps with IADL            OT Problem List: Decreased strength;Decreased activity tolerance;Impaired balance (sitting and/or standing);Decreased knowledge of use of DME or AE;Cardiopulmonary status limiting activity      OT Treatment/Interventions: Self-care/ADL training;DME and/or AE instruction;Therapeutic activities;Patient/family education;Balance training    OT Goals(Current goals can be found in the care plan section) Acute Rehab OT Goals Patient Stated Goal: return home OT Goal Formulation: With patient Time For Goal Achievement: 09/23/20 Potential to Achieve Goals: Good ADL Goals Pt Will Perform Grooming: with modified independence;standing Pt Will Perform Lower Body Bathing: with modified independence;sit to/from stand Pt Will Perform Lower Body Dressing: with modified independence;sit to/from stand Pt Will Transfer to Toilet: with modified independence;ambulating;bedside commode (over toilet) Pt Will Perform Toileting - Clothing Manipulation and hygiene: with modified independence;sit to/from stand  OT Frequency: Min 2X/week   Barriers to D/C:            Co-evaluation              AM-PAC OT "6 Clicks" Daily Activity     Outcome Measure Help from another person eating meals?: None Help from another person taking care of personal grooming?: A Little Help from another person toileting, which includes using toliet, bedpan, or urinal?: A Little Help from another person bathing (including washing, rinsing, drying)?: A Little Help from another person to put on and taking off regular upper body clothing?: None Help from another person to put on and taking off regular lower body clothing?: A Little 6 Click Score: 20   End of Session Equipment Utilized During Treatment: Rolling walker Nurse Communication: Other (comment) (wound vac alarming)  Activity Tolerance: Patient tolerated treatment  well Patient left: in bed;with call bell/phone within reach  OT Visit Diagnosis: Unsteadiness on feet (R26.81);Other abnormalities of gait and mobility (R26.89);Muscle weakness (generalized) (M62.81)                Time: 3875-6433 OT Time Calculation (min): 15 min Charges:  OT General Charges $OT Visit: 1 Visit OT Evaluation $OT Eval Moderate Complexity: 1 Mod  Nestor Lewandowsky, OTR/L Acute Rehabilitation Services Pager: [phone] Office: [phone]  Malka So 09/09/2020, 12:28 PM

## 2020-09-09 NOTE — Consult Note (Signed)
[facility] Nurse Consult Note: Patient receiving care in [facility]. Reason for Consult: VAC to BLE fasciotomy sites Wound type: surgical Pressure Injury POA: Yes/No/NA Measurement: right lateral wound measures 15 cm x 6 cm x 1 cm; wound bed pink Left lateral wound measures 17 cm x 9 cm x 1.1 cm; wound bed pink Wound bed: Drainage (amount, consistency, odor) none Periwound: protected by Mepitel silicone dressing Dressing procedure/placement/frequency: 1 piece of black foam placed over each wound after wounds were covered with Mepitel silicone dressing. Drape applied; immediate seal obtained. Dressings were "Y" connected to one VAC machine. Patient tolerated very well without complaints of pain. Son at bedside. Supplies at bedside for Wednesday. Val Riles, RN, MSN, CWOCN, CNS-BC, pager [phone]

## 2020-09-10 ENCOUNTER — Ambulatory Visit: Payer: Medicare Other

## 2020-09-10 LAB — CBC
HCT: 22.6 % — ABNORMAL LOW (ref 39.0–52.0)
HCT: 29.8 % — ABNORMAL LOW (ref 39.0–52.0)
Hemoglobin: 6.8 g/dL — CL (ref 13.0–17.0)
Hemoglobin: 9.3 g/dL — ABNORMAL LOW (ref 13.0–17.0)
MCH: 33 pg (ref 26.0–34.0)
MCH: 32.2 pg (ref 26.0–34.0)
MCHC: 30.1 g/dL (ref 30.0–36.0)
MCHC: 31.2 g/dL (ref 30.0–36.0)
MCV: 109.7 fL — ABNORMAL HIGH (ref 80.0–100.0)
MCV: 103.1 fL — ABNORMAL HIGH (ref 80.0–100.0)
Platelets: 236 10*3/uL (ref 150–400)
Platelets: 212 10*3/uL (ref 150–400)
RBC: 2.06 MIL/uL — ABNORMAL LOW (ref 4.22–5.81)
RBC: 2.89 MIL/uL — ABNORMAL LOW (ref 4.22–5.81)
RDW: 15.9 % — ABNORMAL HIGH (ref 11.5–15.5)
RDW: 17 % — ABNORMAL HIGH (ref 11.5–15.5)
WBC: 6 10*3/uL (ref 4.0–10.5)
WBC: 6 10*3/uL (ref 4.0–10.5)
nRBC: 0.3 % — ABNORMAL HIGH (ref 0.0–0.2)
nRBC: 0 % (ref 0.0–0.2)

## 2020-09-10 LAB — PREPARE RBC (CROSSMATCH)

## 2020-09-10 MED ORDER — BISACODYL 5 MG PO TBEC
5.0000 mg | DELAYED_RELEASE_TABLET | Freq: Every day | ORAL | Status: DC | PRN
  Administered 2020-09-10: 23:00:00 5 mg via ORAL
  Filled 2020-09-10: qty 1

## 2020-09-10 MED ORDER — SODIUM CHLORIDE 0.9% IV SOLUTION: Freq: Once | INTRAVENOUS | Status: AC

## 2020-09-10 NOTE — Progress Notes (Signed)
Physical Therapy Treatment Patient Details Name: Eddie Hodge MRN: [id number] DOB: [birth year] Today's Date: 09/10/2020    History of Present Illness Pt adm with bilateral calf fasciotomy site dehiscence likely from underlying hematoma. Underwent debridement of bilateral calf wounds and VAC placement on 09/07/20. PMH - s/p bilateral lower extremity arterial thrombectomies with four compartment fasciotomies 08/22/20 (he was discharged 09/02/20), colon CA, etoh abuse.    PT Comments    Pt seated in recliner on arrival.  Pt eager to mobilize.  Pt required supervision for gt and transfers.  Reports minor discomfort in great toe on L foot plantar surface.  Pt is progressing well and HHPT remains apporpriate for d/c.  Educated on avoid crossing of B LEs and placed pillow as wedge between legs.     Follow Up Recommendations  Home health PT;Supervision - Intermittent     Equipment Recommendations  None recommended by PT    Recommendations for Other Services       Precautions / Restrictions Precautions Precautions: Fall;Other (comment) Precaution Comments: B LE wound vac, avoid crossing legs in chair or bed to avoid pressure on incision/wound sites. Restrictions Weight Bearing Restrictions: No    Mobility  Bed Mobility Overal bed mobility: Modified Independent                Transfers Overall transfer level: Needs assistance Equipment used: Rolling walker (2 wheeled) Transfers: Sit to/from Stand Sit to Stand: Supervision         General transfer comment: Close supervision for safety  Ambulation/Gait Ambulation/Gait assistance: Min guard Gait Distance (Feet): 200 Feet   Gait Pattern/deviations: Step-through pattern Gait velocity: increased.   General Gait Details: quick cadence with cues for safety with wound vac lines.  Cues for safety with RW when turning and backing.   Stairs             Wheelchair Mobility    Modified Rankin (Stroke Patients Only)        Balance Overall balance assessment: Needs assistance Sitting-balance support: No upper extremity supported;Feet supported Sitting balance-Leahy Scale: Good       Standing balance-Leahy Scale: Poor                              Cognition Arousal/Alertness: Awake/alert Behavior During Therapy: WFL for tasks assessed/performed Overall Cognitive Status: Within Functional Limits for tasks assessed                                        Exercises      General Comments        Pertinent Vitals/Pain Pain Assessment: Faces Faces Pain Scale: Hurts a little bit Pain Location: L great toe plantar aspect ( dry callused skin no lesion visable ) Pain Descriptors / Indicators: Discomfort Pain Intervention(s): Monitored during session (informed nursing.)    Home Living                      Prior Function            PT Goals (current goals can now be found in the care plan section) Acute Rehab PT Goals Patient Stated Goal: return home Potential to Achieve Goals: Good Progress towards PT goals: Progressing toward goals    Frequency    Min 3X/week      PT Plan Current plan remains appropriate  Co-evaluation              AM-PAC PT "6 Clicks" Mobility   Outcome Measure  Help needed turning from your back to your side while in a flat bed without using bedrails?: None Help needed moving from lying on your back to sitting on the side of a flat bed without using bedrails?: None Help needed moving to and from a bed to a chair (including a wheelchair)?: A Little Help needed standing up from a chair using your arms (e.g., wheelchair or bedside chair)?: A Little Help needed to walk in hospital room?: A Little Help needed climbing 3-5 steps with a railing? : A Little 6 Click Score: 20    End of Session Equipment Utilized During Treatment: Gait belt Activity Tolerance: Patient tolerated treatment well Patient left: with call  bell/phone within reach;in bed;with family/visitor present Nurse Communication: Mobility status PT Visit Diagnosis: Other abnormalities of gait and mobility (R26.89);Muscle weakness (generalized) (M62.81)     Time: 9093-1121 PT Time Calculation (min) (ACUTE ONLY): 15 min  Charges:  $Gait Training: 8-22 mins                     Erasmo Leventhal , PTA Acute Rehabilitation Services Pager [phone] Office [phone]     Karlin Binion [name] 09/10/2020, 6:05 PM

## 2020-09-10 NOTE — TOC Initial Note (Signed)
Transition of Care (TOC) - Initial/Assessment Note  [name] RN, BSN Transitions of Care Unit 4E- RN Case Manager See Treatment Team for direct phone #    Patient Details  Name: Eddie Hodge MRN: [id number] Date of Birth: 02-Jan-1941  Transition of [facility]) CM/SW Contact:    [name], RN Phone Number: 09/10/2020, 4:56 PM  Clinical Narrative:                 Pt from home, was [facility] (RN/PT/OT) prior to admit. Pt s/p fem/fem with fasciotomy for non healing wounds, with wound VAC placed. Noted [place] orders have been placed for transition home- also noted pt will need home wound VAC- [employer] home wound vac form placed on shadow chart for signature - call made to M. Collins with vascular and msg left with OR nurse.  Wound VAC form pending signature- once signed will fax to [employer] to begin insurance approval process.   Expected Discharge Plan: [place] Barriers to Discharge: Continued Medical Work up,Equipment Delay   Patient Goals and CMS Choice Patient states their goals for this hospitalization and ongoing recovery are:: return home CMS Medicare.gov Compare Post Acute Care list provided to:: Patient Choice offered to / list presented to : [place]  Expected Discharge Plan and Services Expected Discharge Plan: [place]   Discharge Planning Services: CM Consult Post Acute Care Choice: Durable Medical Equipment,Home Health,Resumption of Svcs/PTA Provider Living arrangements for the past 2 months: Single Family Home                 DME Arranged: Vac DME Agency: [employer]       HH Arranged: RN,PT,OT [place] Agency: Corinne Date [facility] Agency Contacted: 09/10/20      Prior Living Arrangements/Services Living arrangements for the past 2 months: Single Family Home Lives with:: Adult Children Patient language and need for interpreter reviewed:: Yes Do you feel safe going back to the place  where you live?: Yes      Need for Family Participation in Patient Care: Yes (Comment) Care giver support system in place?: Yes (comment) Current home services: DME Criminal Activity/Legal Involvement Pertinent to Current Situation/Hospitalization: No - Comment as needed  Activities of Daily Living      Permission Sought/Granted Permission sought to share information with : Chartered certified accountant granted to share information with : Yes, Verbal Permission Granted     Permission granted to share info w AGENCY: HH        Emotional Assessment Appearance:: Appears stated age Attitude/Demeanor/Rapport: Engaged Affect (typically observed): Appropriate Orientation: : Oriented to Self,Oriented to Place,Oriented to  Time,Oriented to Situation Alcohol / Substance Use: Not Applicable Psych Involvement: No (comment)  Admission diagnosis:  Wound dehiscence [T81.30XA] Non-healing surgical wound [T81.89XA] Patient Active Problem List   Diagnosis Date Noted  . Non-healing surgical wound 09/07/2020  . Lower limb ischemia 08/22/2020  . Ischemia of lower extremity 08/22/2020  . EDEMA 04/04/2009  . COLON CANCER 04/03/2009  . ALCOHOL ABUSE 04/03/2009  . ANISOCORIA 04/03/2009  . BRADYCARDIA 04/03/2009  . SUBDURAL HEMATOMA, CHRONIC 04/03/2009   PCP:  Patient, No Pcp Per Pharmacy:   [place], [place] [place] [address] Phone: [phone] Fax: [phone]     Social Determinants of Health (SDOH) Interventions    Readmission Risk Interventions Readmission Risk Prevention Plan 09/02/2020  Post Dischage Appt Complete  Medication Screening Complete  Transportation Screening Complete  Some recent data might be hidden

## 2020-09-10 NOTE — Progress Notes (Addendum)
[facility] [place]  Subjective  - comfortable, receiving PRBC.   Objective 132/71 60 98.4 F (36.9 C) (Oral) 13 100%  Intake/Output Summary (Last 24 hours) at 09/10/2020 0735 Last data filed at 09/10/2020 9518 Gross per 24 hour  Intake 1065 ml  Output 1250 ml  Net -185 ml    Wound vacs to suction, 50 cc OP last 24 hours Motor intact B LE, feet warm well perfused Lungs non labored breathing  Assessment/Planning: POD # 3 79 yo s/p sharp excisional debridement of bilateral lateral fasiciotomy sites   Plan for wound vac change tomorrow  PT/OT recommend [facility] Receiving 2 units PRBC currently, possible discharge tomorrow   Roxy Horseman 09/10/2020 7:35 AM --  Laboratory Lab Results: Recent Labs    09/09/20 0806 09/10/20 0101  WBC 8.1 6.0  HGB 7.1* 6.8*  HCT 23.4* 22.6*  PLT 257 236   BMET Recent Labs    09/08/20 0732 09/09/20 0806  NA 141 143  K 5.5* 4.5  CL 113* 114*  CO2 19* 23  GLUCOSE 110* 94  BUN 28* 27*  CREATININE 1.64* 1.64*  CALCIUM 8.7* 9.0    COAG Lab Results  Component Value Date   INR 1.1 08/21/2020   INR 0.9 02/12/2009   INR 1.1 12/10/2008   No results found for: PTT   I have seen and evaluated the patient. I agree with the PA note as documented above.  79 year old male previously underwent bilateral lower extremity thrombectomies with fasciotomies.  Readmitted with some necrosis of his bilateral lateral fasciotomy incisions and underwent debridement by Dr. Stanford Breed in the OR over the weekend.  VAC changes yesterday by wound care.  Muscle looks healthy.  Hemoglobin this morning is 6.8 and is getting 2 units pRBC's transfused.  PT OT recommending home health.  Will work toward getting home vacs with care management.  Assuming vacs are set up and he responds to blood transfusions will hopefully discharge home tomorrow.  Pain well controlled.  Marty Heck, MD [facility]  [place] Office: [phone]

## 2020-09-10 NOTE — Progress Notes (Signed)
Pt transferred to chair from bed standby assist with walker. Tolerated well. Call light in reach.  Clyde Canterbury, RN

## 2020-09-11 ENCOUNTER — Encounter (HOSPITAL_COMMUNITY): Payer: Self-pay | Admitting: Vascular Surgery

## 2020-09-11 LAB — CBC
HCT: 31 % — ABNORMAL LOW (ref 39.0–52.0)
Hemoglobin: 9.8 g/dL — ABNORMAL LOW (ref 13.0–17.0)
MCH: 32.1 pg (ref 26.0–34.0)
MCHC: 31.6 g/dL (ref 30.0–36.0)
MCV: 101.6 fL — ABNORMAL HIGH (ref 80.0–100.0)
Platelets: 232 10*3/uL (ref 150–400)
RBC: 3.05 MIL/uL — ABNORMAL LOW (ref 4.22–5.81)
RDW: 17 % — ABNORMAL HIGH (ref 11.5–15.5)
WBC: 7.1 10*3/uL (ref 4.0–10.5)
nRBC: 0 % (ref 0.0–0.2)

## 2020-09-11 LAB — BPAM RBC
Blood Product Expiration Date: 202201142359
Blood Product Expiration Date: 202201142359
ISSUE DATE / TIME: 202112140302
ISSUE DATE / TIME: 202112140602
Unit Type and Rh: 5100
Unit Type and Rh: 5100

## 2020-09-11 LAB — TYPE AND SCREEN
ABO/RH(D): O POS
Antibody Screen: NEGATIVE
Unit division: 0
Unit division: 0

## 2020-09-11 MED ORDER — NICOTINE 14 MG/24HR TD PT24
14.0000 mg | MEDICATED_PATCH | Freq: Every day | TRANSDERMAL | Status: DC
  Administered 2020-09-11 – 2020-09-12 (×2): 14 mg via TRANSDERMAL
  Filled 2020-09-11 (×2): qty 1

## 2020-09-11 MED ORDER — OXYCODONE-ACETAMINOPHEN 5-325 MG PO TABS: 1.0000 | ORAL_TABLET | Freq: Four times a day (QID) | ORAL | 0 refills | Status: DC | PRN

## 2020-09-11 NOTE — TOC Transition Note (Addendum)
Transition of Care (TOC) - CM/SW Discharge Note [name] RN, BSN Transitions of Care Unit 4E- RN Case Manager See Treatment Team for direct phone #    Patient Details  Name: CYLE KENYON MRN: [id number] Date of Birth: 1941/06/02  Transition of Care [facility] CM/SW Contact:  [name], RN Phone Number: 09/11/2020, 4:23 PM   Clinical Narrative:    Pt stable for transition home today once home wound VAC approved and delivered here for home. [employer] home [facility]: [place] Rhonda W form has been signed this AM- faxed form to [facility] and call made to [place] regarding home VAC needs- hopeful for insurance approval today- Olivia Mackie will keep this Probation officer updated on approval and release. If not approved today pt will need to stay overnight until [facility] approved. Orders have been placed for [facility] needs- and [name] has been updated on resumption of care needs to include home VAC drsg changes- with first one in the home to be on Friday 12/17.   1540- spoke with Olivia Mackie- at [facility]- home wound VAC is still in process- awaiting insurance auth for release. Not sure at this time of day if we will get home VAC approved and delivered today for transition home. Charge/bedside RN updated - and family/pt aware.   1705- Notified by [employer] that home wound VAC has been approved, after hours courier to deliver tonight. Have updated bedside RN who has updated pt/family     Final next level of care: Home w Home Health Services Barriers to Discharge: Continued Medical Work up,Equipment Delay   Patient Goals and CMS Choice Patient states their goals for this hospitalization and ongoing recovery are:: return home CMS Medicare.gov Compare Post Acute Care list provided to:: Patient Choice offered to / list presented to : [place]  Discharge Placement               Home with [facility]        Discharge Plan and Services   Discharge Planning Services: CM Consult Post Acute Care Choice: Durable Medical Equipment,Home Health,Resumption  of Svcs/PTA Provider          DME Arranged: Vac DME Agency: [employer] Date DME Agency Contacted: 09/11/20 Time DME Agency Contacted: [id number] Representative spoke with at DME Agency: [place]: RN,PT,OT [place] Agency: [place] Date [place]: 09/11/20 Time HH Agency Contacted: 1500 Representative spoke with at [place]: [place] ([place]) Interventions     Readmission Risk Interventions Readmission Risk Prevention Plan 09/11/2020 09/02/2020  Post Dischage Appt - Complete  Medication Screening - Complete  Transportation Screening Complete Complete  PCP or Specialist Appt within 5-7 Days Complete -  Home Care Screening Complete -  Medication Review (RN CM) Complete -  Some recent data might be hidden

## 2020-09-11 NOTE — Discharge Summary (Signed)
[facility] Discharge Summary   Patient ID:  Eddie Hodge MRN: [id number] DOB/AGE: Jul 24, 1941 79 y.o.  Admit date: 09/06/2020 Discharge date: 09/13/2020 Date of Surgery: 09/07/2020 Surgeon: Surgeon(s): Cherre Robins, MD  Admission Diagnosis: Wound dehiscence [T81.30XA] Non-healing surgical wound [T81.89XA]  Discharge Diagnoses:  Wound dehiscence [T81.30XA] Non-healing surgical wound [T81.89XA]  Secondary Diagnoses: Past Medical History:  Diagnosis Date  . Cancer (HCC)    HISTORY OF COLON CANCER  . HOH (hard of hearing)   . Lower limb ischemia     Procedure(s): EXCISION AND DEBRIDEMENT OF BILATERAL LOWER EXTREMITY WOUND APPLICATION OF WOUND VAC  Discharged Condition: good  HPI: Eddie Hodge is a 79 y.o. male well known to our service s/p bilateral lower extremity arterial thrombectomies with four compartment fasciotomies 08/22/20. He was discharged 09/02/20.  He presented to our office for evaluation.  See images from 09/05/2020 for details.  Staples were removed and local wound care initiated. Presented with bilateral lateral calf fasciotomy site dehiscence.  Likely from underlying hematoma causing pressure necrosis.  We will plan for washout and excisional debridement today in OR.  Plan for [facility] application.  Keep n.p.o.  Check Covid screen.  Admit for pain control postoperatively  Hospital Course:  Eddie Hodge is a 79 y.o. male is S/P Bilateral Procedure(s): EXCISION AND DEBRIDEMENT OF BILATERAL LOWER EXTREMITY WOUND APPLICATION OF WOUND VAC Extubated: POD # 0 Post-op wounds healing well Pt. Ambulating, voiding and taking PO diet without difficulty. Pt pain controlled with PO pain meds. Labs as below Complications:none  POD#1 patient did well overnight. Fasciotomy sites intact. Wound VACs to lateral fasciotomy sites with some sanguinous output. Lower extremities well perfused and warm with brisk signals by Doppler. Remained on Heparin. Plan for  transition to [place]. Pain well controlled  POD#2 tolerated wound vac changes at bedside. Wounds healthy appearing. Lower extremities well perfused and warm with doppler signals. Hemodynamically stable.  PT/ OT recommended Home Health services. Eliquis restarted.   POD#3 hemoglobin dropped to 7.1. Transfused 2 units PRBCs. Otherwise pain well controlled. Wounds healing well. Lower extremities well perfused. OOB and Ambulating without difficulty. Home Health PT/ OT arranged as well as [place] nursing for wound VAC changes.  POD#4 remained comfortable. Tolerating therapy. Wounds intact and healing appropriately. Wound VACs changed with healthy tissue. Stable for discharge however did not obtain authorization for Wound VAC to go home with.  POD#5 bilateral lower extremities remain well perfused with brisk doppler signals. Medial fasciotomy incisions healing well. Staples removed. Home Wound VAC arranged with 3x/ week changes with Home Health services. Will also get Home PT. Eliquis restarted. He is stable for discharge home. He will continue Statin and Eliquis on discharge. PDMP was reviewed and post operative pain medication was sent to patients requested pharmacy. He will follow up with Dr. Carlis Abbott on 10/01/20   Consults:  Treatment Team:  Cherre Robins, MD Marty Heck, MD  Significant Diagnostic Studies: CBC Lab Results  Component Value Date   WBC 6.8 09/12/2020   HGB 9.9 (L) 09/12/2020   HCT 29.7 (L) 09/12/2020   MCV 100.0 09/12/2020   PLT 200 09/12/2020    BMET    Component Value Date/Time   NA 143 09/09/2020 0806   K 4.5 09/09/2020 0806   CL 114 (H) 09/09/2020 0806   CO2 23 09/09/2020 0806   GLUCOSE 94 09/09/2020 0806   BUN 27 (H) 09/09/2020 0806   CREATININE 1.64 (H) 09/09/2020 0806   CALCIUM 9.0  09/09/2020 0806   GFRNONAA 42 (L) 09/09/2020 0806   GFRAA  03/14/2009 0625    >60        The eGFR has been calculated using the MDRD equation. This calculation has not  been validated in all clinical situations. eGFR's persistently <60 mL/min signify possible Chronic Kidney Disease.   COAG Lab Results  Component Value Date   INR 1.1 08/21/2020   INR 0.9 02/12/2009   INR 1.1 12/10/2008     Disposition:  Discharge to :Home Discharge Instructions    Discharge patient   Complete by: As directed    Once Home Wound VAC arranged   Discharge disposition: 01-Home or Self Care   Discharge patient date: 09/11/2020     Allergies as of 09/12/2020   No Known Allergies     Medication List    STOP taking these medications   cephALEXin 500 MG capsule Commonly known as: Keflex     TAKE these medications   apixaban 5 MG Tabs tablet Commonly known as: ELIQUIS Take 1 tablet (5 mg total) by mouth 2 (two) times daily.   Fish Oil 1000 MG Caps Take 1,000 mg by mouth daily.   folic acid 1 MG tablet Commonly known as: FOLVITE Take 1 tablet (1 mg total) by mouth daily.   oxyCODONE-acetaminophen 5-325 MG tablet Commonly known as: PERCOCET/ROXICET Take 1 tablet by mouth every 6 (six) hours as needed for moderate pain.   rosuvastatin 20 MG tablet Commonly known as: CRESTOR Take 1 tablet (20 mg total) by mouth daily.   thiamine 100 MG tablet Take 1 tablet (100 mg total) by mouth daily.      Verbal and written Discharge instructions given to the patient. Wound care per Discharge AVS  Follow-up Information    Marty Heck, MD Follow up.   Specialty: Vascular Surgery Why: 10/01/20 the office will contact the patient with their follow up appointment Contact information: [place] [address] [place] Follow up.   Specialty: [place] Why: [facility] services resumed (RN/PT/OT)- RN to do home VAC drsg change on 12/17- they will contact you to schedule visit (home VAC arranged with [employer]) Contact information: [address]  [place] [address] [phone]                Signed: Karoline Caldwell 09/13/2020, 9:48 AM

## 2020-09-11 NOTE — Progress Notes (Signed)
Pt donned and doffed socks without difficulty. Ambulated with RW and supervision to bathroom and then sink. Mild unsteadiness with standing ADL (pericare and grooming at sink) requiring min guard assist. Instructed pt to sit at sink at home when performing sponge bathing, receptive. Pt eager to go home today.    09/11/20 1129  OT Visit Information  Assistance Needed +1  History of Present Illness Pt adm with bilateral calf fasciotomy site dehiscence likely from underlying hematoma. Underwent debridement of bilateral calf wounds and VAC placement on 09/07/20. PMH - s/p bilateral lower extremity arterial thrombectomies with four compartment fasciotomies 08/22/20 (he was discharged 09/02/20), colon CA, etoh abuse.  Precautions  Precautions Fall;Other (comment)  Precaution Comments B LE wound vac, avoid crossing legs in chair or bed to avoid pressure on incision/wound sites.  Pain Assessment  Pain Assessment No/denies pain  Cognition  Arousal/Alertness Awake/alert  Behavior During Therapy WFL for tasks assessed/performed  Overall Cognitive Status Within Functional Limits for tasks assessed  General Comments pt HOH  ADL  Overall ADL's  Needs assistance/impaired  Grooming Wash/dry hands;Min guard;Standing  Lower Body Dressing Set up;Sitting/lateral leans  Lower Body Dressing Details (indicate cue type and reason) donned socks  Toilet Transfer Ambulation;RW;Supervision/safety  Toileting- Water quality scientist and Hygiene Min guard;Sit to/from stand  Functional mobility during ADLs Rolling walker;Supervision/safety  General ADL Comments pt instructed to sit at sink rather than attempting to stand to sponge bathe, reports he has a shower stool he can use  Bed Mobility  General bed mobility comments pt in chair  Balance  Sitting balance-Leahy Scale Normal  Standing balance-Leahy Scale Fair  Standing balance comment min guard in static standing at sink and with pericare  Transfers  Overall  transfer level Modified independent  Equipment used Rolling walker (2 wheeled)  OT - End of Session  Equipment Utilized During Treatment Rolling walker  Activity Tolerance Patient tolerated treatment well  Patient left in chair;with call bell/phone within reach  OT Assessment/Plan  OT Plan Discharge plan remains appropriate  OT Visit Diagnosis Unsteadiness on feet (R26.81);Other abnormalities of gait and mobility (R26.89);Muscle weakness (generalized) (M62.81)  OT Frequency (ACUTE ONLY) Min 2X/week  Follow Up Recommendations Home health OT  OT Equipment None recommended by OT  AM-PAC OT "6 Clicks" Daily Activity Outcome Measure (Version 2)  Help from another person eating meals? 4  Help from another person taking care of personal grooming? 3  Help from another person toileting, which includes using toliet, bedpan, or urinal? 3  Help from another person bathing (including washing, rinsing, drying)? 3  Help from another person to put on and taking off regular upper body clothing? 4  Help from another person to put on and taking off regular lower body clothing? 3  6 Click Score 20  OT Goal Progression  Progress towards OT goals Progressing toward goals  Acute Rehab OT Goals  Patient Stated Goal return home  OT Goal Formulation With patient  Time For Goal Achievement 09/23/20  Potential to Achieve Goals Good  OT Time Calculation  OT Start Time (ACUTE ONLY) 1114  OT Stop Time (ACUTE ONLY) 1129  OT Time Calculation (min) 15 min  OT General Charges  $OT Visit 1 Visit  OT Treatments  $Self Care/Home Management  8-22 mins  Nestor Lewandowsky, OTR/L Acute Rehabilitation Services Pager: [phone] Office: [phone]

## 2020-09-11 NOTE — Progress Notes (Addendum)
Progress Note    09/11/2020 8:23 AM 4 Days Post-Op  Subjective:  No complaints. Eating breakfast. Wanting to go home   Vitals:   09/11/20 0440 09/11/20 0814  BP: 114/83 129/78  Pulse: 63 85  Resp: 20 19  Temp: 98.6 F (37 C)   SpO2: 100% 99%   Physical Exam: Cardiac: regular Lungs: non labored Incisions:  b groin incisions clean, dry and intact. Bilateral lower extremity fasciotomy incisions with staples bilateral medial sites, wound VAC to bilateral lateral fasciotomy sites with good suction, 25 cc output overnight. Wound VAC already changed this morning with reported beefy read granulation tissue Extremities:  Lower extremities well perfused with 2+ femoral pulses bilaterally, brisk PT/DP/ Pero signals bilaterally Neurologic: alert and oriented  CBC    Component Value Date/Time   WBC 7.1 09/11/2020 0049   RBC 3.05 (L) 09/11/2020 0049   HGB 9.8 (L) 09/11/2020 0049   HGB 12.9 (L) 03/08/2008 1012   HCT 31.0 (L) 09/11/2020 0049   HCT 39.4 03/08/2008 1012   PLT 232 09/11/2020 0049   PLT 222 03/08/2008 1012   MCV 101.6 (H) 09/11/2020 0049   MCV 97.7 03/08/2008 1012   MCH 32.1 09/11/2020 0049   MCHC 31.6 09/11/2020 0049   RDW 17.0 (H) 09/11/2020 0049   RDW 12.4 03/08/2008 1012   LYMPHSABS 1.3 08/29/2020 0230   LYMPHSABS 2.0 03/08/2008 1012   MONOABS 1.2 (H) 08/29/2020 0230   MONOABS 0.5 03/08/2008 1012   EOSABS 0.2 08/29/2020 0230   EOSABS 0.2 03/08/2008 1012   BASOSABS 0.0 08/29/2020 0230   BASOSABS 0.1 03/08/2008 1012    BMET    Component Value Date/Time   NA 143 09/09/2020 0806   K 4.5 09/09/2020 0806   CL 114 (H) 09/09/2020 0806   CO2 23 09/09/2020 0806   GLUCOSE 94 09/09/2020 0806   BUN 27 (H) 09/09/2020 0806   CREATININE 1.64 (H) 09/09/2020 0806   CALCIUM 9.0 09/09/2020 0806   GFRNONAA 42 (L) 09/09/2020 0806   GFRAA  03/14/2009 0625    >60        The eGFR has been calculated using the MDRD equation. This calculation has not been validated in  all clinical situations. eGFR's persistently <60 mL/min signify possible Chronic Kidney Disease.    INR    Component Value Date/Time   INR 1.1 08/21/2020 1821     Intake/Output Summary (Last 24 hours) at 09/11/2020 0823 Last data filed at 09/11/2020 0500 Gross per 24 hour  Intake 936 ml  Output 1300 ml  Net -364 ml     Assessment/Plan:  79 y.o. male is s/p excisional debridement of bilateral lateral fasciotomy sites 4 Days Post-Op. Transfused yesterday with 2 units of PRBC. Hemoglobin responded appropriately, 9.8 this morning. Otherwise hemodynamically stable. Wound VACs changed this morning with reported 100% beefy red granulation tissue. Pain well controlled. Anticipate discharge later today. Order for Home Wound VAC signed on patients chart. [place] services arranged. He will go home on Eliquis and statin. He has follow up arranged on 10/01/20 with Dr. Tracey Harries, PA-C [facility] [phone] 09/11/2020 8:23 AM   I have seen and evaluated the patient. I agree with the PA note as documented above.  79 year old male previously underwent bilateral lower extremity thrombectomy.  Admitted over the weekend for necrosis and hematoma bilateral lateral fasciotomy incisions.  Debridement in OR with vacs over weekend.  VAC change this morning by wound care and looks good.  He  has very brisk Doppler signals bilaterally and feet well perfused.  Therapy is recommending home health.  Will likely plan for discharge today if we get his home vacs set up.  He has follow-up set in early January for wound checks.  Will need VAC changes three times a week.  He is back on his Eliquis given history of thromboembolic event.  Marty Heck, MD [facility] [place] Office: [phone]

## 2020-09-11 NOTE — Consult Note (Signed)
[place] Nurse wound follow up Patient receiving care in [place]. Wound type: BLE lateral fasciotomy sites Measurement: deferred Wound bed: both are 100% beefy red granulation tissue Drainage (amount, consistency, odor) serosanginous in cannister Periwound: intact and protected by Mepitel Dressing procedure/placement/frequency: All dressing components from Monday removed. New Mepitel applied over wounds, then topped with one piece of black foam. Drape applied, Y connected to one machine, immediate seal obtained. Patient tolerated without any expressions of pain. Val Riles, RN, MSN, CWOCN, CNS-BC, pager [phone]

## 2020-09-11 NOTE — Progress Notes (Signed)
Physical Therapy Treatment Patient Details Name: Eddie Hodge MRN: [id number] DOB: 04-02-1941 Today's Date: 09/11/2020    History of Present Illness Pt adm with bilateral calf fasciotomy site dehiscence likely from underlying hematoma. Underwent debridement of bilateral calf wounds and VAC placement on 09/07/20. PMH - s/p bilateral lower extremity arterial thrombectomies with four compartment fasciotomies 08/22/20 (he was discharged 09/02/20), colon CA, etoh abuse.    PT Comments    Pt supine in bed on arrival.  Upon standing noted with soiled pad and went to sink to clean up.  Pt washing bottom in standing with mild LOB but able to right and self correct.  He continues to require RW for support.  Pt to d/c home today with support from his daughter.  HHPT remains appropriate.     Follow Up Recommendations  Home health PT;Supervision - Intermittent     Equipment Recommendations  None recommended by PT    Recommendations for Other Services       Precautions / Restrictions Precautions Precautions: Fall;Other (comment) Precaution Comments: B LE wound vac, avoid crossing legs in chair or bed to avoid pressure on incision/wound sites. Restrictions Weight Bearing Restrictions: No    Mobility  Bed Mobility Overal bed mobility: Modified Independent Bed Mobility: Supine to Sit              Transfers Overall transfer level: Modified independent Equipment used: Rolling walker (2 wheeled) Transfers: Sit to/from Stand              Ambulation/Gait Ambulation/Gait assistance: Supervision Gait Distance (Feet): 300 Feet Assistive device: Rolling walker (2 wheeled) Gait Pattern/deviations: Step-through pattern;Trunk flexed Gait velocity: increased.   General Gait Details: Cues to step closer to device.  Minor LOB turning in tight space in room but able to self correct this session.   Stairs             Wheelchair Mobility    Modified Rankin (Stroke Patients  Only)       Balance Overall balance assessment: Needs assistance   Sitting balance-Leahy Scale: Normal       Standing balance-Leahy Scale: Fair                              Cognition Arousal/Alertness: Awake/alert Behavior During Therapy: WFL for tasks assessed/performed Overall Cognitive Status: Within Functional Limits for tasks assessed                                 General Comments: pt HOH      Exercises      General Comments        Pertinent Vitals/Pain Pain Assessment: Faces Faces Pain Scale: No hurt    Home Living                      Prior Function            PT Goals (current goals can now be found in the care plan section) Acute Rehab PT Goals Patient Stated Goal: return home Potential to Achieve Goals: Good Progress towards PT goals: Progressing toward goals    Frequency    Min 3X/week      PT Plan Current plan remains appropriate    Co-evaluation              AM-PAC PT "6 Clicks" Mobility   Outcome Measure  Help  needed turning from your back to your side while in a flat bed without using bedrails?: None Help needed moving from lying on your back to sitting on the side of a flat bed without using bedrails?: None Help needed moving to and from a bed to a chair (including a wheelchair)?: None Help needed standing up from a chair using your arms (e.g., wheelchair or bedside chair)?: None Help needed to walk in hospital room?: A Little Help needed climbing 3-5 steps with a railing? : A Little 6 Click Score: 22    End of Session Equipment Utilized During Treatment: Gait belt Activity Tolerance: Patient tolerated treatment well Patient left: with call bell/phone within reach;with family/visitor present;in chair Nurse Communication: Mobility status PT Visit Diagnosis: Other abnormalities of gait and mobility (R26.89);Muscle weakness (generalized) (M62.81)     Time: 1751-0258 PT Time  Calculation (min) (ACUTE ONLY): 15 min  Charges:  $Gait Training: 8-22 mins                     Erasmo Leventhal , PTA Acute Rehabilitation Services Pager [phone] Office [phone]    Soraya Paquette [name] 09/11/2020, 11:08 AM

## 2020-09-12 LAB — CBC
HCT: 29.7 % — ABNORMAL LOW (ref 39.0–52.0)
Hemoglobin: 9.9 g/dL — ABNORMAL LOW (ref 13.0–17.0)
MCH: 33.3 pg (ref 26.0–34.0)
MCHC: 33.3 g/dL (ref 30.0–36.0)
MCV: 100 fL (ref 80.0–100.0)
Platelets: 200 10*3/uL (ref 150–400)
RBC: 2.97 MIL/uL — ABNORMAL LOW (ref 4.22–5.81)
RDW: 16.2 % — ABNORMAL HIGH (ref 11.5–15.5)
WBC: 6.8 10*3/uL (ref 4.0–10.5)
nRBC: 0 % (ref 0.0–0.2)

## 2020-09-12 NOTE — Progress Notes (Signed)
[facility] [place]  Subjective  - no complaints.  Awaiting home vac.   Objective 135/73 62 98 F (36.7 C) (Oral) 17 100%  Intake/Output Summary (Last 24 hours) at 09/12/2020 0807 Last data filed at 09/12/2020 0000 Gross per 24 hour  Intake 240 ml  Output 1150 ml  Net -910 ml    Brisk PT signals bilaterally Medial fasciotomies healing with staples Lateral fasciotomies with vac dressings  Laboratory Lab Results: Recent Labs    09/11/20 0049 09/12/20 0113  WBC 7.1 6.8  HGB 9.8* 9.9*  HCT 31.0* 29.7*  PLT 232 200   BMET No results for input(s): NA, K, CL, CO2, GLUCOSE, BUN, CREATININE, CALCIUM in the last 72 hours.  COAG Lab Results  Component Value Date   INR 1.1 08/21/2020   INR 0.9 02/12/2009   INR 1.1 12/10/2008   No results found for: PTT  Assessment/Planning:  79 year old male previously underwent bilateral lower extremity thrombectomy.  Admitted over the weekend for necrosis and hematoma bilateral lateral fasciotomy incisions.  Debridement in OR with vacs over weekend.  VAC change yesterday by wound care and looks good.  He has very brisk Doppler signals bilaterally and feet well perfused.  Therapy is recommending home health. Will plan for discharge once he gets home vacs set up.  He has follow-up set in early January for wound checks.  Will need VAC changes three times a week.  He is back on his Eliquis given history of thromboembolic event.  Updated daughter by phone yesterday.  Will order removal of medial fasciotomy staples today since has been 3 weeks.  Marty Heck 09/12/2020 8:07 AM --

## 2020-09-12 NOTE — Progress Notes (Signed)
Patient discharge teaching done. Daughter present. Answered all questions from daughter and patient.  Wound vac and supplies sent home with patient in box. Telemetry box removed. CCMD notified. IV removed. Site clean, dry and intact. Patient transported to vehicle via volunteer services in a wheelchair.  Daymon Larsen, RN

## 2020-09-12 NOTE — TOC Progression Note (Signed)
Transition of Care (TOC) - Progression Note  [name] RN, BSN Transitions of Care Unit 4E- RN Case Manager See Treatment Team for direct phone #    Patient Details  Name: JOZEF EISENBEIS MRN: [id number] Date of Birth: [birth year]  Transition of Care [facility]) CM/SW Contact  Dahlia Client, Romeo Rabon, RN Phone Number: 09/12/2020, 12:16 PM  Clinical Narrative:    Notified this AM by [employer] that courier had barrier last night with delivery of home wound VAC and delivery will be this AM ASAP by [employer] for discharge home today as soon as wound VAC arrives.    Expected Discharge Plan: [place] Barriers to Discharge: Continued Medical Work up,Equipment Delay  Expected Discharge Plan and Services Expected Discharge Plan: [place]   Discharge Planning Services: CM Consult Post Acute Care Choice: Durable Medical Equipment,Home Health,Resumption of Svcs/PTA Provider Living arrangements for the past 2 months: Single Family Home Expected Discharge Date: 09/11/20               DME Arranged: Harlin Heys DME Agency: Soyla Murphy Date DME Agency Contacted: 09/11/20 Time DME Agency Contacted: 53 Representative spoke with at DME Agency: [place]: RN,PT,OT [place]: [place] Date [place]: 09/11/20 Time [place]: 1500 Representative spoke with at [place]: [place] ([place]) Interventions    Readmission Risk Interventions Readmission Risk Prevention Plan 09/11/2020 09/02/2020  Post Dischage Appt - Complete  Medication Screening - Complete  Transportation Screening Complete Complete  PCP or Specialist Appt within 5-7 Days Complete -  Home Care Screening Complete -  Medication Review (RN CM) Complete -  Some recent data might be hidden

## 2020-09-12 NOTE — Care Management Important Message (Signed)
Important Message  Patient Details  Name: Eddie Hodge MRN: [id number] Date of Birth: Mar 04, 1941   Medicare Important Message Given:  Yes     [name] 09/12/2020, 11:46 AM

## 2020-09-12 NOTE — Progress Notes (Signed)
Physical Therapy Treatment Patient Details Name: Eddie Hodge MRN: [id number] DOB: May 21, 1941 Today's Date: 09/12/2020    History of Present Illness Pt adm with bilateral calf fasciotomy site dehiscence likely from underlying hematoma. Underwent debridement of bilateral calf wounds and VAC placement on 09/07/20. PMH - s/p bilateral lower extremity arterial thrombectomies with four compartment fasciotomies 08/22/20 (he was discharged 09/02/20), colon CA, etoh abuse.    PT Comments    Pt supine in bed on arrival this session.  Pt pleasant and agreeable to PT session.  Performed increased gt distance and LE strengthening.  Pt to d/c home today.     Follow Up Recommendations  Home health PT;Supervision - Intermittent     Equipment Recommendations  None recommended by PT    Recommendations for Other Services       Precautions / Restrictions Precautions Precautions: Fall;Other (comment) Precaution Comments: B LE wound vac, avoid crossing legs in chair or bed to avoid pressure on incision/wound sites. Restrictions Weight Bearing Restrictions: No    Mobility  Bed Mobility Overal bed mobility: Modified Independent                Transfers Overall transfer level: Modified independent Equipment used: Rolling walker (2 wheeled) Transfers: Sit to/from Stand Sit to Stand: Modified independent (Device/Increase time)            Ambulation/Gait Ambulation/Gait assistance: Supervision Gait Distance (Feet): 400 Feet Assistive device: Rolling walker (2 wheeled) Gait Pattern/deviations: Step-through pattern;Trunk flexed     General Gait Details: Cues to step closer to device to improve posture.  NO LOB this session.   Stairs             Wheelchair Mobility    Modified Rankin (Stroke Patients Only)       Balance Overall balance assessment: Needs assistance Sitting-balance support: No upper extremity supported;Feet supported Sitting balance-Leahy Scale:  Normal       Standing balance-Leahy Scale: Fair Standing balance comment: min guard in static standing at sink and with pericare                            Cognition Arousal/Alertness: Awake/alert Behavior During Therapy: WFL for tasks assessed/performed Overall Cognitive Status: Within Functional Limits for tasks assessed                                 General Comments: pt HOH      Exercises General Exercises - Lower Extremity Ankle Circles/Pumps: AROM;Both;10 reps;Supine Quad Sets: AROM;Both;10 reps;Supine Heel Slides: AROM;Both;10 reps Hip ABduction/ADduction: AROM;Both;10 reps;Supine Straight Leg Raises: AROM;Both;10 reps;Supine    General Comments        Pertinent Vitals/Pain Pain Assessment: No/denies pain Pain Score: 0-No pain Faces Pain Scale: No hurt Pain Location: L great toe plantar aspect ( dry callused skin no lesion visable ) Pain Descriptors / Indicators: Discomfort Pain Intervention(s): Monitored during session;Repositioned    Home Living                      Prior Function            PT Goals (current goals can now be found in the care plan section) Acute Rehab PT Goals Potential to Achieve Goals: Good Progress towards PT goals: Progressing toward goals    Frequency    Min 3X/week      PT Plan Current plan remains  appropriate    Co-evaluation              AM-PAC PT "6 Clicks" Mobility   Outcome Measure  Help needed turning from your back to your side while in a flat bed without using bedrails?: None Help needed moving from lying on your back to sitting on the side of a flat bed without using bedrails?: None Help needed moving to and from a bed to a chair (including a wheelchair)?: None Help needed standing up from a chair using your arms (e.g., wheelchair or bedside chair)?: None Help needed to walk in hospital room?: A Little Help needed climbing 3-5 steps with a railing? : A Little 6 Click  Score: 22    End of Session Equipment Utilized During Treatment: Gait belt Activity Tolerance: Patient tolerated treatment well Patient left: with call bell/phone within reach;with family/visitor present;in chair Nurse Communication: Mobility status PT Visit Diagnosis: Other abnormalities of gait and mobility (R26.89);Muscle weakness (generalized) (M62.81)     Time: 8315-1761 PT Time Calculation (min) (ACUTE ONLY): 16 min  Charges:  $Gait Training: 8-22 mins                     Eddie Hodge , PTA Acute Rehabilitation Services Pager [phone] Office [phone]     Eddie Hodge Eddie Hodge 09/12/2020, 3:17 PM

## 2020-09-12 NOTE — Progress Notes (Signed)
PT staples removed from bilateral LE without incident. Pt tolerated well.  Jerald Kief, RN

## 2020-09-25 ENCOUNTER — Telehealth: Payer: Self-pay

## 2020-09-25 NOTE — Telephone Encounter (Signed)
error 

## 2020-09-25 NOTE — Telephone Encounter (Signed)
Spoke with Lawanna Kobus from [facility], patient's wound bed is looking much better. Even, beefy red tissues, wound edges look good. Would like to remove wound vac to avoid tissue damage and switch wet to dry. Advised her this was fine, twice daily dressing changes and as needed for drainage. She will see patient again on Friday. Will call Monday to update office on wound status.

## 2020-10-01 ENCOUNTER — Other Ambulatory Visit: Payer: Self-pay

## 2020-10-01 ENCOUNTER — Ambulatory Visit (INDEPENDENT_AMBULATORY_CARE_PROVIDER_SITE_OTHER): Payer: Self-pay | Admitting: Vascular Surgery

## 2020-10-01 ENCOUNTER — Encounter: Payer: Self-pay | Admitting: Vascular Surgery

## 2020-10-01 DIAGNOSIS — I998 Other disorder of circulatory system: Secondary | ICD-10-CM

## 2020-10-01 MED ORDER — LACTULOSE 10 G PO PACK: 10.0000 g | PACK | Freq: Three times a day (TID) | ORAL | 0 refills | Status: AC

## 2020-10-01 MED ORDER — OXYCODONE-ACETAMINOPHEN 5-325 MG PO TABS: 1.0000 | ORAL_TABLET | Freq: Four times a day (QID) | ORAL | 0 refills | Status: DC | PRN

## 2020-10-01 NOTE — Progress Notes (Signed)
Patient name: Eddie Hodge MRN: [id number] DOB: 21-Mar-1941 Sex: male  REASON FOR VISIT: Postop check after bilateral lower extremity thrombectomy with left iliac thrombectomy and bilateral lower extremity fasciotomy  HPI: Eddie Hodge is a 80 y.o. male with history of tobacco abuse that presents for hospital follow-up and postop check.  Initially underwent bilateral lower extremity thrombectomies with left iliac thrombectomy and lower extremity fasciotomies on 08/22/2020 when he presented with bilateral lower extremity acute limb ischemia.  I suspect that this was related to thromboembolic event from severely diseased thoracic and abdominal aorta.  Ultimately he later required his bilateral fasciotomy incisions debrided and is now on wet-to-dry dressings where the skin had necrosis.  He is at home now with his son and tolerating Eliquis that we started for his thromboembolic events.  His main concern is constipation.  His legs feel fine.  Past Medical History:  Diagnosis Date  . Cancer (HCC)    HISTORY OF COLON CANCER  . HOH (hard of hearing)   . Lower limb ischemia     Past Surgical History:  Procedure Laterality Date  . APPLICATION OF WOUND VAC Bilateral 09/07/2020   Procedure: APPLICATION OF WOUND VAC;  Surgeon: Cherre Robins, MD;  Location: [place];  Service: Vascular;  Laterality: Bilateral;  . FASCIOTOMY Bilateral 08/21/2020   Procedure: FOUR Compartment FASCIOTOMY;  Surgeon: Marty Heck, MD;  Location: [place];  Service: Vascular;  Laterality: Bilateral;  . I & D EXTREMITY Bilateral 09/07/2020   Procedure: EXCISION AND DEBRIDEMENT OF BILATERAL LOWER EXTREMITY WOUND;  Surgeon: Cherre Robins, MD;  Location: [place];  Service: Vascular;  Laterality: Bilateral;  . THROMBECTOMY FEMORAL ARTERY Bilateral 08/21/2020   Procedure: THROMBECTOMY BILATERAL FEMORAL ARTERY and Left iliac thrombectomy.;  Surgeon: Marty Heck, MD;  Location: [place];  Service: Vascular;  Laterality:  Bilateral;    History reviewed. No pertinent family history.  SOCIAL HISTORY: Social History   Tobacco Use  . Smoking status: Current Every Day Smoker    Types: Cigarettes  . Smokeless tobacco: Never Used  Substance Use Topics  . Alcohol use: Yes    Comment: " i DRANK BEFOREE I came here "    No Known Allergies  Current Outpatient Medications  Medication Sig Dispense Refill  . apixaban (ELIQUIS) 5 MG TABS tablet Take 1 tablet (5 mg total) by mouth 2 (two) times daily. 60 tablet 5  . folic acid (FOLVITE) 1 MG tablet Take 1 tablet (1 mg total) by mouth daily. 30 tablet 5  . lactulose (CEPHULAC) 10 g packet Take 1 packet (10 g total) by mouth 3 (three) times daily. 30 each 0  . Omega-3 Fatty Acids (FISH OIL) 1000 MG CAPS Take 1,000 mg by mouth daily.    . rosuvastatin (CRESTOR) 20 MG tablet Take 1 tablet (20 mg total) by mouth daily. 30 tablet 3  . thiamine 100 MG tablet Take 1 tablet (100 mg total) by mouth daily. 30 tablet 3  . oxyCODONE-acetaminophen (PERCOCET/ROXICET) 5-325 MG tablet Take 1 tablet by mouth every 6 (six) hours as needed for moderate pain. 30 tablet 0   No current facility-administered medications for this visit.    REVIEW OF SYSTEMS:  [X]  denotes positive finding, [ ]  denotes negative finding Cardiac  Comments:  Chest pain or chest pressure:    Shortness of breath upon exertion:    Short of breath when lying flat:    Irregular heart rhythm:  Vascular    Pain in calf, thigh, or hip brought on by ambulation:    Pain in feet at night that wakes you up from your sleep:     Blood clot in your veins:    Leg swelling:         Pulmonary    Oxygen at home:    Productive cough:     Wheezing:         Neurologic    Sudden weakness in arms or legs:     Sudden numbness in arms or legs:     Sudden onset of difficulty speaking or slurred speech:    Temporary loss of vision in one eye:     Problems with dizziness:         Gastrointestinal    Blood in  stool:     Vomited blood:         Genitourinary    Burning when urinating:     Blood in urine:        Psychiatric    Major depression:         Hematologic    Bleeding problems:    Problems with blood clotting too easily:        Skin    Rashes or ulcers:        Constitutional    Fever or chills:      PHYSICAL EXAM: Vitals:   10/01/20 1554  BP: 95/67  Pulse: (!) 111  Resp: 16  Temp: 98 F (36.7 C)  TempSrc: Temporal  Weight: 148 lb (67.1 kg)  Height: 6' (1.829 m)    GENERAL: The patient is a well-nourished male, in no acute distress. The vital signs are documented above. CARDIAC: There is a regular rate and rhythm.  VASCULAR:  Bilateral groin incisions well-healed Palpable femoral pulses bilaterally Palpable pedal pulses bilaterally Medical fasciotomies healed Lateral fasciotomies open wounds with good beefy tissue         DATA:   None  Assessment/Plan:  80 year old male status post bilateral lower extremity thrombectomies with fasciotomies for bilateral lower extremity acute limb ischemia on 08/22/2020.  He later required debridement of his lateral fasciotomy incisions bilaterally where the skin had necrosis.  He has palpable pedal pulses on exam and is tolerating his Eliquis and I suspect this was thromboembolic from his thoracic and abdominal aorta areas of mural thrombus.  I will send him to plastic surgery for evaluation of skin grafting or other options to facilitate wound healing.  We had them in [facility] dressings for a while and he is now back in a wet-to-dry dressing per his son with home health.  I did prescribe him lactulose for his constipation I refilled his pain meds.  We also discussed colace over the counter. I will see him back in a month for wound check.   Cephus Shelling, MD [facility] [place] Office: [phone]

## 2020-10-02 ENCOUNTER — Telehealth: Payer: Self-pay

## 2020-10-02 NOTE — Telephone Encounter (Signed)
Pharmacy called, they do not have lactulose packets, they only carry it in the liquid. Instructed them to maintain the sig and amounts, but to change to the lactulose they carried.

## 2020-10-11 DIAGNOSIS — Z9181 History of falling: Secondary | ICD-10-CM | POA: Diagnosis not present

## 2020-10-11 DIAGNOSIS — H9190 Unspecified hearing loss, unspecified ear: Secondary | ICD-10-CM | POA: Diagnosis not present

## 2020-10-11 DIAGNOSIS — I70223 Atherosclerosis of native arteries of extremities with rest pain, bilateral legs: Secondary | ICD-10-CM | POA: Diagnosis not present

## 2020-10-11 DIAGNOSIS — G40909 Epilepsy, unspecified, not intractable, without status epilepticus: Secondary | ICD-10-CM | POA: Diagnosis not present

## 2020-10-11 DIAGNOSIS — F17200 Nicotine dependence, unspecified, uncomplicated: Secondary | ICD-10-CM | POA: Diagnosis not present

## 2020-10-11 DIAGNOSIS — T8131XA Disruption of external operation (surgical) wound, not elsewhere classified, initial encounter: Secondary | ICD-10-CM | POA: Diagnosis not present

## 2020-10-11 DIAGNOSIS — Z7901 Long term (current) use of anticoagulants: Secondary | ICD-10-CM | POA: Diagnosis not present

## 2020-10-11 DIAGNOSIS — Z8673 Personal history of transient ischemic attack (TIA), and cerebral infarction without residual deficits: Secondary | ICD-10-CM | POA: Diagnosis not present

## 2020-10-16 ENCOUNTER — Ambulatory Visit: Payer: Medicare HMO | Admitting: Podiatry

## 2020-10-16 DIAGNOSIS — Z7901 Long term (current) use of anticoagulants: Secondary | ICD-10-CM | POA: Diagnosis not present

## 2020-10-16 DIAGNOSIS — H9190 Unspecified hearing loss, unspecified ear: Secondary | ICD-10-CM | POA: Diagnosis not present

## 2020-10-16 DIAGNOSIS — T8131XA Disruption of external operation (surgical) wound, not elsewhere classified, initial encounter: Secondary | ICD-10-CM | POA: Diagnosis not present

## 2020-10-16 DIAGNOSIS — I70223 Atherosclerosis of native arteries of extremities with rest pain, bilateral legs: Secondary | ICD-10-CM | POA: Diagnosis not present

## 2020-10-16 DIAGNOSIS — F17200 Nicotine dependence, unspecified, uncomplicated: Secondary | ICD-10-CM | POA: Diagnosis not present

## 2020-10-16 DIAGNOSIS — G40909 Epilepsy, unspecified, not intractable, without status epilepticus: Secondary | ICD-10-CM | POA: Diagnosis not present

## 2020-10-16 DIAGNOSIS — Z8673 Personal history of transient ischemic attack (TIA), and cerebral infarction without residual deficits: Secondary | ICD-10-CM | POA: Diagnosis not present

## 2020-10-16 DIAGNOSIS — Z9181 History of falling: Secondary | ICD-10-CM | POA: Diagnosis not present

## 2020-10-17 DIAGNOSIS — G40909 Epilepsy, unspecified, not intractable, without status epilepticus: Secondary | ICD-10-CM | POA: Diagnosis not present

## 2020-10-17 DIAGNOSIS — F17200 Nicotine dependence, unspecified, uncomplicated: Secondary | ICD-10-CM | POA: Diagnosis not present

## 2020-10-17 DIAGNOSIS — Z8673 Personal history of transient ischemic attack (TIA), and cerebral infarction without residual deficits: Secondary | ICD-10-CM | POA: Diagnosis not present

## 2020-10-17 DIAGNOSIS — H9190 Unspecified hearing loss, unspecified ear: Secondary | ICD-10-CM | POA: Diagnosis not present

## 2020-10-17 DIAGNOSIS — Z7901 Long term (current) use of anticoagulants: Secondary | ICD-10-CM | POA: Diagnosis not present

## 2020-10-17 DIAGNOSIS — Z9181 History of falling: Secondary | ICD-10-CM | POA: Diagnosis not present

## 2020-10-17 DIAGNOSIS — I70223 Atherosclerosis of native arteries of extremities with rest pain, bilateral legs: Secondary | ICD-10-CM | POA: Diagnosis not present

## 2020-10-17 DIAGNOSIS — T8131XA Disruption of external operation (surgical) wound, not elsewhere classified, initial encounter: Secondary | ICD-10-CM | POA: Diagnosis not present

## 2020-10-22 DIAGNOSIS — Z8673 Personal history of transient ischemic attack (TIA), and cerebral infarction without residual deficits: Secondary | ICD-10-CM | POA: Diagnosis not present

## 2020-10-22 DIAGNOSIS — Z7901 Long term (current) use of anticoagulants: Secondary | ICD-10-CM | POA: Diagnosis not present

## 2020-10-22 DIAGNOSIS — Z9181 History of falling: Secondary | ICD-10-CM | POA: Diagnosis not present

## 2020-10-22 DIAGNOSIS — G40909 Epilepsy, unspecified, not intractable, without status epilepticus: Secondary | ICD-10-CM | POA: Diagnosis not present

## 2020-10-22 DIAGNOSIS — T8131XA Disruption of external operation (surgical) wound, not elsewhere classified, initial encounter: Secondary | ICD-10-CM | POA: Diagnosis not present

## 2020-10-22 DIAGNOSIS — F17200 Nicotine dependence, unspecified, uncomplicated: Secondary | ICD-10-CM | POA: Diagnosis not present

## 2020-10-22 DIAGNOSIS — I70223 Atherosclerosis of native arteries of extremities with rest pain, bilateral legs: Secondary | ICD-10-CM | POA: Diagnosis not present

## 2020-10-22 DIAGNOSIS — H9190 Unspecified hearing loss, unspecified ear: Secondary | ICD-10-CM | POA: Diagnosis not present

## 2020-10-25 DIAGNOSIS — Z9181 History of falling: Secondary | ICD-10-CM | POA: Diagnosis not present

## 2020-10-25 DIAGNOSIS — Z7901 Long term (current) use of anticoagulants: Secondary | ICD-10-CM | POA: Diagnosis not present

## 2020-10-25 DIAGNOSIS — H9190 Unspecified hearing loss, unspecified ear: Secondary | ICD-10-CM | POA: Diagnosis not present

## 2020-10-25 DIAGNOSIS — T8131XA Disruption of external operation (surgical) wound, not elsewhere classified, initial encounter: Secondary | ICD-10-CM | POA: Diagnosis not present

## 2020-10-25 DIAGNOSIS — Z8673 Personal history of transient ischemic attack (TIA), and cerebral infarction without residual deficits: Secondary | ICD-10-CM | POA: Diagnosis not present

## 2020-10-25 DIAGNOSIS — I70223 Atherosclerosis of native arteries of extremities with rest pain, bilateral legs: Secondary | ICD-10-CM | POA: Diagnosis not present

## 2020-10-25 DIAGNOSIS — G40909 Epilepsy, unspecified, not intractable, without status epilepticus: Secondary | ICD-10-CM | POA: Diagnosis not present

## 2020-10-25 DIAGNOSIS — F17200 Nicotine dependence, unspecified, uncomplicated: Secondary | ICD-10-CM | POA: Diagnosis not present

## 2020-10-28 DIAGNOSIS — Z8673 Personal history of transient ischemic attack (TIA), and cerebral infarction without residual deficits: Secondary | ICD-10-CM | POA: Diagnosis not present

## 2020-10-28 DIAGNOSIS — F17200 Nicotine dependence, unspecified, uncomplicated: Secondary | ICD-10-CM | POA: Diagnosis not present

## 2020-10-28 DIAGNOSIS — H9190 Unspecified hearing loss, unspecified ear: Secondary | ICD-10-CM | POA: Diagnosis not present

## 2020-10-28 DIAGNOSIS — I70223 Atherosclerosis of native arteries of extremities with rest pain, bilateral legs: Secondary | ICD-10-CM | POA: Diagnosis not present

## 2020-10-28 DIAGNOSIS — Z9181 History of falling: Secondary | ICD-10-CM | POA: Diagnosis not present

## 2020-10-28 DIAGNOSIS — T8131XA Disruption of external operation (surgical) wound, not elsewhere classified, initial encounter: Secondary | ICD-10-CM | POA: Diagnosis not present

## 2020-10-28 DIAGNOSIS — Z7901 Long term (current) use of anticoagulants: Secondary | ICD-10-CM | POA: Diagnosis not present

## 2020-10-28 DIAGNOSIS — G40909 Epilepsy, unspecified, not intractable, without status epilepticus: Secondary | ICD-10-CM | POA: Diagnosis not present

## 2020-10-31 DIAGNOSIS — Z7901 Long term (current) use of anticoagulants: Secondary | ICD-10-CM | POA: Diagnosis not present

## 2020-10-31 DIAGNOSIS — F17200 Nicotine dependence, unspecified, uncomplicated: Secondary | ICD-10-CM | POA: Diagnosis not present

## 2020-10-31 DIAGNOSIS — G40909 Epilepsy, unspecified, not intractable, without status epilepticus: Secondary | ICD-10-CM | POA: Diagnosis not present

## 2020-10-31 DIAGNOSIS — T8131XA Disruption of external operation (surgical) wound, not elsewhere classified, initial encounter: Secondary | ICD-10-CM | POA: Diagnosis not present

## 2020-10-31 DIAGNOSIS — I70223 Atherosclerosis of native arteries of extremities with rest pain, bilateral legs: Secondary | ICD-10-CM | POA: Diagnosis not present

## 2020-10-31 DIAGNOSIS — H9190 Unspecified hearing loss, unspecified ear: Secondary | ICD-10-CM | POA: Diagnosis not present

## 2020-10-31 DIAGNOSIS — Z9181 History of falling: Secondary | ICD-10-CM | POA: Diagnosis not present

## 2020-10-31 DIAGNOSIS — Z8673 Personal history of transient ischemic attack (TIA), and cerebral infarction without residual deficits: Secondary | ICD-10-CM | POA: Diagnosis not present

## 2020-11-04 DIAGNOSIS — Z7901 Long term (current) use of anticoagulants: Secondary | ICD-10-CM | POA: Diagnosis not present

## 2020-11-04 DIAGNOSIS — F17200 Nicotine dependence, unspecified, uncomplicated: Secondary | ICD-10-CM | POA: Diagnosis not present

## 2020-11-04 DIAGNOSIS — I70223 Atherosclerosis of native arteries of extremities with rest pain, bilateral legs: Secondary | ICD-10-CM | POA: Diagnosis not present

## 2020-11-04 DIAGNOSIS — Z9181 History of falling: Secondary | ICD-10-CM | POA: Diagnosis not present

## 2020-11-04 DIAGNOSIS — T8131XA Disruption of external operation (surgical) wound, not elsewhere classified, initial encounter: Secondary | ICD-10-CM | POA: Diagnosis not present

## 2020-11-04 DIAGNOSIS — H9190 Unspecified hearing loss, unspecified ear: Secondary | ICD-10-CM | POA: Diagnosis not present

## 2020-11-04 DIAGNOSIS — Z8673 Personal history of transient ischemic attack (TIA), and cerebral infarction without residual deficits: Secondary | ICD-10-CM | POA: Diagnosis not present

## 2020-11-04 DIAGNOSIS — G40909 Epilepsy, unspecified, not intractable, without status epilepticus: Secondary | ICD-10-CM | POA: Diagnosis not present

## 2020-11-05 ENCOUNTER — Other Ambulatory Visit: Payer: Self-pay

## 2020-11-05 ENCOUNTER — Ambulatory Visit (INDEPENDENT_AMBULATORY_CARE_PROVIDER_SITE_OTHER): Payer: Medicare HMO | Admitting: Vascular Surgery

## 2020-11-05 ENCOUNTER — Encounter: Payer: Self-pay | Admitting: Vascular Surgery

## 2020-11-05 VITALS — BP 99/55 | HR 76 | Temp 97.9°F | Resp 16 | Ht 72.0 in | Wt 153.0 lb

## 2020-11-05 DIAGNOSIS — I998 Other disorder of circulatory system: Secondary | ICD-10-CM

## 2020-11-05 MED ORDER — OXYCODONE-ACETAMINOPHEN 5-325 MG PO TABS: 1.0000 | ORAL_TABLET | Freq: Four times a day (QID) | ORAL | 0 refills | Status: AC | PRN

## 2020-11-05 NOTE — Progress Notes (Signed)
Patient name: Eddie Hodge MRN: [id number] DOB: [birth year] Sex: male  REASON FOR VISIT: Ongoing postop check after bilateral lower extremity thrombectomy with left iliac thrombectomy and bilateral lower extremity fasciotomy  HPI: Eddie Hodge is a 80 y.o. male with history of tobacco abuse that presents for ongoing hospital follow-up and wound check.  He initially underwent bilateral lower extremity thrombectomies with left iliac thrombectomy and bilateral lower extremity fasciotomies on 08/22/2020 when he presented with bilateral lower extremity acute limb ischemia.  I suspect that this was related to thromboembolic event from severely diseased thoracic and abdominal aorta.  Ultimately he later required his bilateral fasciotomy incisions debrided and is now in wet-to-dry dressings where the skin had necrosis.  He is at home now with his son and tolerating Eliquis that we started for his thromboembolic events.  We did refer him to plastic surgery as well.    Today no new concerns.  States the wounds are healing. No pain in the feet.  Past Medical History:  Diagnosis Date  . Cancer (HCC)    HISTORY OF COLON CANCER  . HOH (hard of hearing)   . Lower limb ischemia     Past Surgical History:  Procedure Laterality Date  . APPLICATION OF WOUND VAC Bilateral 09/07/2020   Procedure: APPLICATION OF WOUND VAC;  Surgeon: Cherre Robins, MD;  Location: [place];  Service: Vascular;  Laterality: Bilateral;  . FASCIOTOMY Bilateral 08/21/2020   Procedure: FOUR Compartment FASCIOTOMY;  Surgeon: Marty Heck, MD;  Location: [place];  Service: Vascular;  Laterality: Bilateral;  . I & D EXTREMITY Bilateral 09/07/2020   Procedure: EXCISION AND DEBRIDEMENT OF BILATERAL LOWER EXTREMITY WOUND;  Surgeon: Cherre Robins, MD;  Location: [place];  Service: Vascular;  Laterality: Bilateral;  . THROMBECTOMY FEMORAL ARTERY Bilateral 08/21/2020   Procedure: THROMBECTOMY BILATERAL FEMORAL ARTERY and Left iliac  thrombectomy.;  Surgeon: Marty Heck, MD;  Location: [place];  Service: Vascular;  Laterality: Bilateral;    History reviewed. No pertinent family history.  SOCIAL HISTORY: Social History   Tobacco Use  . Smoking status: Current Every Day Smoker    Types: Cigarettes  . Smokeless tobacco: Never Used  Substance Use Topics  . Alcohol use: Yes    Comment: " i DRANK BEFOREE I came here "    No Known Allergies  Current Outpatient Medications  Medication Sig Dispense Refill  . apixaban (ELIQUIS) 5 MG TABS tablet Take 1 tablet (5 mg total) by mouth 2 (two) times daily. 60 tablet 5  . folic acid (FOLVITE) 1 MG tablet Take 1 tablet (1 mg total) by mouth daily. 30 tablet 5  . lactulose (CEPHULAC) 10 g packet Take 1 packet (10 g total) by mouth 3 (three) times daily. 30 each 0  . Omega-3 Fatty Acids (FISH OIL) 1000 MG CAPS Take 1,000 mg by mouth daily.    . rosuvastatin (CRESTOR) 20 MG tablet Take 1 tablet (20 mg total) by mouth daily. 30 tablet 3  . thiamine 100 MG tablet Take 1 tablet (100 mg total) by mouth daily. 30 tablet 3  . oxyCODONE-acetaminophen (PERCOCET/ROXICET) 5-325 MG tablet Take 1 tablet by mouth every 6 (six) hours as needed for moderate pain. (Patient not taking: Reported on 11/05/2020) 30 tablet 0   No current facility-administered medications for this visit.    REVIEW OF SYSTEMS:  [X]  denotes positive finding, [ ]  denotes negative finding Cardiac  Comments:  Chest pain or chest pressure:  Shortness of breath upon exertion:    Short of breath when lying flat:    Irregular heart rhythm:        Vascular    Pain in calf, thigh, or hip brought on by ambulation:    Pain in feet at night that wakes you up from your sleep:     Blood clot in your veins:    Leg swelling:         Pulmonary    Oxygen at home:    Productive cough:     Wheezing:         Neurologic    Sudden weakness in arms or legs:     Sudden numbness in arms or legs:     Sudden onset of  difficulty speaking or slurred speech:    Temporary loss of vision in one eye:     Problems with dizziness:         Gastrointestinal    Blood in stool:     Vomited blood:         Genitourinary    Burning when urinating:     Blood in urine:        Psychiatric    Major depression:         Hematologic    Bleeding problems:    Problems with blood clotting too easily:        Skin    Rashes or ulcers:        Constitutional    Fever or chills:      PHYSICAL EXAM:   GENERAL: The patient is a well-nourished male, in no acute distress. The vital signs are documented above. CARDIAC: There is a regular rate and rhythm.  VASCULAR:  Bilateral groin incisions well-healed Right DP palpable, left PT near triphasic by doppler Medical fasciotomies healed Lateral fasciotomies open wounds with good beefy tissue - healing as pictured below             DATA:   None  Assessment/Plan:  80 year old male status post bilateral lower extremity thrombectomies with fasciotomies for bilateral lower extremity acute limb ischemia on 08/22/2020.  He later required debridement of his lateral fasciotomy incisions bilaterally where the skin had necrosis.  When I saw him a month ago I did refer him to plastic surgery for skin grafting.  Him and his son state they have elected not to do that and he does not want additional surgery.  These wounds are healing nicely with wet-to-dry dressings.  He will be on Eliquis lifelong from my standpoint which I discussed with his family today.  Follow-up in the [facility] in a month for wound check.   Marty Heck, MD [facility] [place] Office: [phone]

## 2020-11-07 DIAGNOSIS — Z7901 Long term (current) use of anticoagulants: Secondary | ICD-10-CM | POA: Diagnosis not present

## 2020-11-07 DIAGNOSIS — I70223 Atherosclerosis of native arteries of extremities with rest pain, bilateral legs: Secondary | ICD-10-CM | POA: Diagnosis not present

## 2020-11-07 DIAGNOSIS — Z9181 History of falling: Secondary | ICD-10-CM | POA: Diagnosis not present

## 2020-11-07 DIAGNOSIS — H9190 Unspecified hearing loss, unspecified ear: Secondary | ICD-10-CM | POA: Diagnosis not present

## 2020-11-07 DIAGNOSIS — T8131XA Disruption of external operation (surgical) wound, not elsewhere classified, initial encounter: Secondary | ICD-10-CM | POA: Diagnosis not present

## 2020-11-07 DIAGNOSIS — Z8673 Personal history of transient ischemic attack (TIA), and cerebral infarction without residual deficits: Secondary | ICD-10-CM | POA: Diagnosis not present

## 2020-11-07 DIAGNOSIS — F17200 Nicotine dependence, unspecified, uncomplicated: Secondary | ICD-10-CM | POA: Diagnosis not present

## 2020-11-07 DIAGNOSIS — G40909 Epilepsy, unspecified, not intractable, without status epilepticus: Secondary | ICD-10-CM | POA: Diagnosis not present

## 2020-11-12 DIAGNOSIS — Z7901 Long term (current) use of anticoagulants: Secondary | ICD-10-CM | POA: Diagnosis not present

## 2020-11-12 DIAGNOSIS — H9190 Unspecified hearing loss, unspecified ear: Secondary | ICD-10-CM | POA: Diagnosis not present

## 2020-11-12 DIAGNOSIS — Z8673 Personal history of transient ischemic attack (TIA), and cerebral infarction without residual deficits: Secondary | ICD-10-CM | POA: Diagnosis not present

## 2020-11-12 DIAGNOSIS — I70223 Atherosclerosis of native arteries of extremities with rest pain, bilateral legs: Secondary | ICD-10-CM | POA: Diagnosis not present

## 2020-11-12 DIAGNOSIS — F17200 Nicotine dependence, unspecified, uncomplicated: Secondary | ICD-10-CM | POA: Diagnosis not present

## 2020-11-12 DIAGNOSIS — T8131XA Disruption of external operation (surgical) wound, not elsewhere classified, initial encounter: Secondary | ICD-10-CM | POA: Diagnosis not present

## 2020-11-12 DIAGNOSIS — G40909 Epilepsy, unspecified, not intractable, without status epilepticus: Secondary | ICD-10-CM | POA: Diagnosis not present

## 2020-11-12 DIAGNOSIS — Z9181 History of falling: Secondary | ICD-10-CM | POA: Diagnosis not present

## 2020-11-14 DIAGNOSIS — Z9181 History of falling: Secondary | ICD-10-CM | POA: Diagnosis not present

## 2020-11-14 DIAGNOSIS — H9190 Unspecified hearing loss, unspecified ear: Secondary | ICD-10-CM | POA: Diagnosis not present

## 2020-11-14 DIAGNOSIS — F17200 Nicotine dependence, unspecified, uncomplicated: Secondary | ICD-10-CM | POA: Diagnosis not present

## 2020-11-14 DIAGNOSIS — Z7901 Long term (current) use of anticoagulants: Secondary | ICD-10-CM | POA: Diagnosis not present

## 2020-11-14 DIAGNOSIS — T8131XA Disruption of external operation (surgical) wound, not elsewhere classified, initial encounter: Secondary | ICD-10-CM | POA: Diagnosis not present

## 2020-11-14 DIAGNOSIS — I70223 Atherosclerosis of native arteries of extremities with rest pain, bilateral legs: Secondary | ICD-10-CM | POA: Diagnosis not present

## 2020-11-14 DIAGNOSIS — G40909 Epilepsy, unspecified, not intractable, without status epilepticus: Secondary | ICD-10-CM | POA: Diagnosis not present

## 2020-11-14 DIAGNOSIS — Z8673 Personal history of transient ischemic attack (TIA), and cerebral infarction without residual deficits: Secondary | ICD-10-CM | POA: Diagnosis not present

## 2020-11-18 DIAGNOSIS — Z8673 Personal history of transient ischemic attack (TIA), and cerebral infarction without residual deficits: Secondary | ICD-10-CM | POA: Diagnosis not present

## 2020-11-18 DIAGNOSIS — Z9181 History of falling: Secondary | ICD-10-CM | POA: Diagnosis not present

## 2020-11-18 DIAGNOSIS — H9190 Unspecified hearing loss, unspecified ear: Secondary | ICD-10-CM | POA: Diagnosis not present

## 2020-11-18 DIAGNOSIS — I70223 Atherosclerosis of native arteries of extremities with rest pain, bilateral legs: Secondary | ICD-10-CM | POA: Diagnosis not present

## 2020-11-18 DIAGNOSIS — G40909 Epilepsy, unspecified, not intractable, without status epilepticus: Secondary | ICD-10-CM | POA: Diagnosis not present

## 2020-11-18 DIAGNOSIS — Z7901 Long term (current) use of anticoagulants: Secondary | ICD-10-CM | POA: Diagnosis not present

## 2020-11-18 DIAGNOSIS — T8131XA Disruption of external operation (surgical) wound, not elsewhere classified, initial encounter: Secondary | ICD-10-CM | POA: Diagnosis not present

## 2020-11-18 DIAGNOSIS — F17200 Nicotine dependence, unspecified, uncomplicated: Secondary | ICD-10-CM | POA: Diagnosis not present

## 2020-11-19 DIAGNOSIS — Z7901 Long term (current) use of anticoagulants: Secondary | ICD-10-CM | POA: Diagnosis not present

## 2020-11-19 DIAGNOSIS — Z8673 Personal history of transient ischemic attack (TIA), and cerebral infarction without residual deficits: Secondary | ICD-10-CM | POA: Diagnosis not present

## 2020-11-19 DIAGNOSIS — T8131XA Disruption of external operation (surgical) wound, not elsewhere classified, initial encounter: Secondary | ICD-10-CM | POA: Diagnosis not present

## 2020-11-19 DIAGNOSIS — H9190 Unspecified hearing loss, unspecified ear: Secondary | ICD-10-CM | POA: Diagnosis not present

## 2020-11-19 DIAGNOSIS — I70223 Atherosclerosis of native arteries of extremities with rest pain, bilateral legs: Secondary | ICD-10-CM | POA: Diagnosis not present

## 2020-11-19 DIAGNOSIS — G40909 Epilepsy, unspecified, not intractable, without status epilepticus: Secondary | ICD-10-CM | POA: Diagnosis not present

## 2020-11-19 DIAGNOSIS — F17200 Nicotine dependence, unspecified, uncomplicated: Secondary | ICD-10-CM | POA: Diagnosis not present

## 2020-11-19 DIAGNOSIS — Z9181 History of falling: Secondary | ICD-10-CM | POA: Diagnosis not present

## 2020-12-03 ENCOUNTER — Ambulatory Visit (INDEPENDENT_AMBULATORY_CARE_PROVIDER_SITE_OTHER): Payer: Medicare HMO | Admitting: Physician Assistant

## 2020-12-03 ENCOUNTER — Other Ambulatory Visit: Payer: Self-pay

## 2020-12-03 VITALS — BP 114/69 | HR 65 | Temp 98.4°F | Resp 20 | Ht 72.0 in | Wt 155.4 lb

## 2020-12-03 DIAGNOSIS — T8189XD Other complications of procedures, not elsewhere classified, subsequent encounter: Secondary | ICD-10-CM

## 2020-12-03 DIAGNOSIS — I998 Other disorder of circulatory system: Secondary | ICD-10-CM | POA: Diagnosis not present

## 2020-12-03 NOTE — Progress Notes (Signed)
Established Patient   History of Present Illness   Eddie Hodge is a 80 y.o. (01/19/1941) male who presents for fasciotomy wound check.  He is status post bilateral lower extremity thrombectomies and fasciotomies on 08/22/2020.  This was believed to be a thromboembolic event for which he is now anticoagulated with Eliquis.  He has been seen periodically postoperatively due to slow to heal fasciotomy wounds.  Wound VAC was discontinued and for the past month he has been doing wet-to-dry dressing changes.  His son is present who states he no longer has home health and he has been performing dressing changes for his father.  Patient denies any rest pain or new tissue changes of bilateral lower extremities  The patient's PMH, PSH, SH, and FamHx were reviewed and are unchanged from prior visit.  Current Outpatient Medications  Medication Sig Dispense Refill  . apixaban (ELIQUIS) 5 MG TABS tablet Take 1 tablet (5 mg total) by mouth 2 (two) times daily. 60 tablet 5  . folic acid (FOLVITE) 1 MG tablet Take 1 tablet (1 mg total) by mouth daily. 30 tablet 5  . lactulose (CEPHULAC) 10 g packet Take 1 packet (10 g total) by mouth 3 (three) times daily. 30 each 0  . Omega-3 Fatty Acids (FISH OIL) 1000 MG CAPS Take 1,000 mg by mouth daily.    Marland Kitchen oxyCODONE-acetaminophen (PERCOCET/ROXICET) 5-325 MG tablet Take 1 tablet by mouth every 6 (six) hours as needed for moderate pain. 20 tablet 0  . rosuvastatin (CRESTOR) 20 MG tablet Take 1 tablet (20 mg total) by mouth daily. 30 tablet 3  . thiamine 100 MG tablet Take 1 tablet (100 mg total) by mouth daily. 30 tablet 3   No current facility-administered medications for this visit.    REVIEW OF SYSTEMS (negative unless checked):   Cardiac:  []  Chest pain or chest pressure? []  Shortness of breath upon activity? []  Shortness of breath when lying flat? []  Irregular heart rhythm?  Vascular:  []  Pain in calf, thigh, or hip brought on by walking? []  Pain in  feet at night that wakes you up from your sleep? []  Blood clot in your veins? []  Leg swelling?  Pulmonary:  []  Oxygen at home? []  Productive cough? []  Wheezing?  Neurologic:  []  Sudden weakness in arms or legs? []  Sudden numbness in arms or legs? []  Sudden onset of difficult speaking or slurred speech? []  Temporary loss of vision in one eye? []  Problems with dizziness?  Gastrointestinal:  []  Blood in stool? []  Vomited blood?  Genitourinary:  []  Burning when urinating? []  Blood in urine?  Psychiatric:  []  Major depression  Hematologic:  []  Bleeding problems? []  Problems with blood clotting?  Dermatologic:  []  Rashes or ulcers?  Constitutional:  []  Fever or chills?  Ear/Nose/Throat:  []  Change in hearing? []  Nose bleeds? []  Sore throat?  Musculoskeletal:  []  Back pain? []  Joint pain? []  Muscle pain?   Physical Examination   Vitals:   12/03/20 0934  BP: 114/69  Pulse: 65  Resp: 20  Temp: 98.4 F (36.9 C)  TempSrc: Temporal  SpO2: 100%  Weight: 155 lb 6.4 oz (70.5 kg)  Height: 6' (1.829 m)   Body mass index is 21.08 kg/m.  General:  WDWN in NAD; vital signs documented above Gait: Not observed HENT: WNL, normocephalic Pulmonary: normal non-labored breathing , without Rales, rhonchi,  wheezing Cardiac: regular HR Abdomen: soft, NT, no masses Skin: without rashes Vascular Exam/Pulses:  Right Left  Radial  2+ (normal) 2+ (normal)  DP 2+ (normal) 1+ (weak)  PT absent absent   Extremities: without ischemic changes, without Gangrene , without cellulitis; with open wounds; see pictures below Musculoskeletal: no muscle wasting or atrophy  Neurologic: A&O X 3;  No focal weakness or paresthesias are detected Psychiatric:  The pt has Normal affect.         Medical Decision Making   Eddie Hodge is a 80 y.o. male who presents for fasciotomy wound check s/p BLE thrombectomies and fasciotomies   BLE well perfused with palpable pedal  pulses  Fasciotomy incisions continue to improve; continue current wound care  Follow up in 4-6 weeks for recheck   Eddie Ligas PA-C [facility] [place] Office: [facility] MD: Eddie Hodge

## 2020-12-04 ENCOUNTER — Telehealth: Payer: Self-pay

## 2020-12-04 NOTE — Telephone Encounter (Signed)
Gave verbal orders to [place] at [facility] to continue wound care orders and may go out to see patient for another 3-4 weeks.

## 2020-12-23 ENCOUNTER — Telehealth: Payer: Self-pay

## 2020-12-23 NOTE — Telephone Encounter (Signed)
Patient came to office with son for wound dressings. Gave him some 4x4s, kerlix and tape - advised him how we wanted the wound dressed and he could get more supplies at a medical supply store or pharmacy.

## 2021-01-06 ENCOUNTER — Ambulatory Visit: Payer: Medicare HMO

## 2021-01-21 ENCOUNTER — Ambulatory Visit: Payer: Medicare HMO

## 2021-01-22 ENCOUNTER — Encounter: Payer: Self-pay | Admitting: Vascular Surgery

## 2021-08-25 ENCOUNTER — Other Ambulatory Visit: Payer: Self-pay

## 2021-08-25 ENCOUNTER — Telehealth: Payer: Self-pay

## 2021-08-25 DIAGNOSIS — I998 Other disorder of circulatory system: Secondary | ICD-10-CM

## 2021-08-25 NOTE — Telephone Encounter (Signed)
Patient's daughter calls today to make patient an appt before she has to return to [place]. Patient had left iliac/femoral thrombectomy and bilateral fasciotomies late 2021. Daughter says that patient has had ongoing left leg pain and numbness for about 3 weeks. Definitely hurts after walking, says sometimes it hurts when he is not doing anything. Denies any ulcers on feet. Placed on schedule for follow up appt bil ABI and LLE art duplex.

## 2021-08-26 ENCOUNTER — Other Ambulatory Visit: Payer: Self-pay

## 2021-08-26 ENCOUNTER — Ambulatory Visit (INDEPENDENT_AMBULATORY_CARE_PROVIDER_SITE_OTHER)
Admission: RE | Admit: 2021-08-26 | Discharge: 2021-08-26 | Disposition: A | Discharge status: Home / Self Care | Payer: Medicare HMO | Source: Ambulatory Visit | Attending: Vascular Surgery | Admitting: Vascular Surgery

## 2021-08-26 ENCOUNTER — Ambulatory Visit: Payer: Medicare HMO | Admitting: Physician Assistant

## 2021-08-26 ENCOUNTER — Ambulatory Visit (HOSPITAL_COMMUNITY)
Admission: RE | Admit: 2021-08-26 | Discharge: 2021-08-26 | Disposition: A | Discharge status: Home / Self Care | Payer: Medicare HMO | Source: Ambulatory Visit | Attending: Vascular Surgery | Admitting: Vascular Surgery

## 2021-08-26 ENCOUNTER — Encounter: Payer: Self-pay | Admitting: Physician Assistant

## 2021-08-26 VITALS — BP 145/80 | HR 67 | Temp 97.9°F | Resp 20 | Ht 72.0 in | Wt 159.3 lb

## 2021-08-26 DIAGNOSIS — I998 Other disorder of circulatory system: Secondary | ICD-10-CM

## 2021-08-26 DIAGNOSIS — F17200 Nicotine dependence, unspecified, uncomplicated: Secondary | ICD-10-CM | POA: Diagnosis not present

## 2021-08-26 NOTE — Progress Notes (Signed)
HISTORY AND PHYSICAL     CC:  follow up. Requesting Provider:  No ref. provider found  HPI: This is a 80 y.o. male who is here today for follow up.  He is s/p RLE thrombectomy of CFA, profunda, SFA, popliteal and tibial arteries, left iliac artery, CFA, profunda, SFA thrombectomy and bilateral 4 compartment fasciotomies on 08/22/2020.  Subsequently, he underwent Sharp excisional debridement of left calf wound (17x7x2cm total volume). Sharp excisional debridement of right calf wound (16x6x2cm total volume). Initiation of negative pressure therapy to bilateral lower extremity wounds on 09/07/2020 by Dr. Stanford Breed.   This was felt to be a thromboembolic event and was put on Eliquis.    Pt was last seen March 2022 and at that time, his fasciotomy wounds were healing and he was doing well.    The pt returns today for follow up accompanied by his daughter who lives in [place].   He states he has pain in the left leg below the knee that comes and goes randomly.  He states he also has some numbness in the left leg.  He states this has been present since he left the hospital.  He denies any claudication or rest pain at night.  He has not had any non healing wounds.  He is compliant with his Eliquis.    The pt is on a statin for cholesterol management.    The pt is on an aspirin.    Other AC:  Eliquis The pt is not on medication for hypertension.  The pt does not have diabetes. Tobacco hx:  current  Pt does not have family hx of AAA.  Past Medical History:  Diagnosis Date   Cancer ([place])    HISTORY OF COLON CANCER   HOH (hard of hearing)    Lower limb ischemia     Past Surgical History:  Procedure Laterality Date   APPLICATION OF WOUND VAC Bilateral 09/07/2020   Procedure: APPLICATION OF WOUND VAC;  Surgeon: Cherre Robins, MD;  Location: [facility];  Service: Vascular;  Laterality: Bilateral;   FASCIOTOMY Bilateral 08/21/2020   Procedure: FOUR Compartment FASCIOTOMY;  Surgeon: Marty Heck, MD;  Location: [facility];  Service: Vascular;  Laterality: Bilateral;   I & D EXTREMITY Bilateral 09/07/2020   Procedure: EXCISION AND DEBRIDEMENT OF BILATERAL LOWER EXTREMITY WOUND;  Surgeon: Cherre Robins, MD;  Location: [facility];  Service: Vascular;  Laterality: Bilateral;   THROMBECTOMY FEMORAL ARTERY Bilateral 08/21/2020   Procedure: THROMBECTOMY BILATERAL FEMORAL ARTERY and Left iliac thrombectomy.;  Surgeon: Marty Heck, MD;  Location: [facility];  Service: Vascular;  Laterality: Bilateral;    No Known Allergies  Current Outpatient Medications  Medication Sig Dispense Refill   apixaban (ELIQUIS) 5 MG TABS tablet Take 1 tablet (5 mg total) by mouth 2 (two) times daily. 60 tablet 5   folic acid (FOLVITE) 1 MG tablet Take 1 tablet (1 mg total) by mouth daily. 30 tablet 5   lactulose (CEPHULAC) 10 g packet Take 1 packet (10 g total) by mouth 3 (three) times daily. 30 each 0   Omega-3 Fatty Acids (FISH OIL) 1000 MG CAPS Take 1,000 mg by mouth daily.     oxyCODONE-acetaminophen (PERCOCET/ROXICET) 5-325 MG tablet Take 1 tablet by mouth every 6 (six) hours as needed for moderate pain. 20 tablet 0   rosuvastatin (CRESTOR) 20 MG tablet Take 1 tablet (20 mg total) by mouth daily. 30 tablet 3   thiamine 100 MG tablet Take 1 tablet (  100 mg total) by mouth daily. 30 tablet 3   No current facility-administered medications for this visit.    Family History:  no family hx of AAA  Social History   Socioeconomic History   Marital status: Single    Spouse name: Not on file   Number of children: Not on file   Years of education: Not on file   Highest education level: Not on file  Occupational History   Not on file  Tobacco Use   Smoking status: Every Day    Types: Cigarettes   Smokeless tobacco: Never  Vaping Use   Vaping Use: Never used  Substance and Sexual Activity   Alcohol use: Yes    Comment: " i DRANK BEFOREE I came here "   Drug use: Never   Sexual activity: Not  on file  Other Topics Concern   Not on file  Social History Narrative   Not on file   Social Determinants of Health   Financial Resource Strain: Not on file  Food Insecurity: Not on file  Transportation Needs: Not on file  Physical Activity: Not on file  Stress: Not on file  Social Connections: Not on file  Intimate Partner Violence: Not on file     REVIEW OF SYSTEMS:   [X]  denotes positive finding, [ ]  denotes negative finding Cardiac  Comments:  Chest pain or chest pressure:    Shortness of breath upon exertion:    Short of breath when lying flat:    Irregular heart rhythm:        Vascular    Pain in calf, thigh, or hip brought on by ambulation:    Pain in feet at night that wakes you up from your sleep:     Blood clot in your veins:    Leg swelling:         Pulmonary    Oxygen at home:    Productive cough:     Wheezing:         Neurologic    Sudden weakness in arms or legs:     Sudden numbness in arms or legs:     Sudden onset of difficulty speaking or slurred speech:    Temporary loss of vision in one eye:     Problems with dizziness:         Gastrointestinal    Blood in stool:     Vomited blood:         Genitourinary    Burning when urinating:     Blood in urine:        Psychiatric    Major depression:         Hematologic    Bleeding problems:    Problems with blood clotting too easily:        Skin    Rashes or ulcers:        Constitutional    Fever or chills:      PHYSICAL EXAMINATION:  Today's Vitals   08/26/21 1057  BP: (!) 145/80  Pulse: 67  Resp: 20  Temp: 97.9 F (36.6 C)  TempSrc: Temporal  SpO2: 97%  Weight: 159 lb 4.8 oz (72.3 kg)  Height: 6' (1.829 m)  PainSc: 4   PainLoc: Leg   Body mass index is 21.6 kg/m.   General:  WDWN in NAD; vital signs documented above Gait: Not observed HENT: WNL, normocephalic; HOH Pulmonary: normal non-labored breathing , without wheezing Cardiac: regular HR, without Murmur; without  carotid bruits Abdomen:  soft, NT, no masses; aortic pulse is not palpable Skin: without rashes Vascular Exam/Pulses:  Right Left  Radial 2+ (normal) 2+ (normal)  Femoral 2+ (normal) 2+ (normal)  Popliteal Unable to palpate Unable to palpate  DP 2+ (normal) Unable to palpate  PT 1+ (weak) 2+ (normal)   Extremities: without ischemic changes, without Gangrene , without cellulitis; without open wounds; fasciotomy wounds healed. Musculoskeletal: no muscle wasting or atrophy  Neurologic: A&O X 3 Psychiatric:  The pt has Normal affect.   Non-Invasive Vascular Imaging:   ABI's/TBI's on 08/26/2021: Right:  1.2/0.51 - Great toe pressure: 79 Left:  1.16/0.85 - Great toe pressure: 132  LLE Arterial duplex on 08/26/2021: +----------+--------+-----+--------+--------+--------+  LEFT      PSV cm/sRatioStenosisWaveformComments  +----------+--------+-----+--------+--------+--------+  CFA Distal111                  biphasic          +----------+--------+-----+--------+--------+--------+  DFA       78                   biphasic          +----------+--------+-----+--------+--------+--------+  SFA Prox  74                   biphasic          +----------+--------+-----+--------+--------+--------+  SFA Mid   78                   biphasic          +----------+--------+-----+--------+--------+--------+  SFA Distal68                   biphasic          +----------+--------+-----+--------+--------+--------+  POP Prox  42                   biphasic          +----------+--------+-----+--------+--------+--------+  POP Distal55                   biphasic          +----------+--------+-----+--------+--------+--------+  ATA Distal46                   biphasic          +----------+--------+-----+--------+--------+--------+  PTA Distal122                  biphasic          +----------+--------+-----+--------+--------+--------+   Summary:   Left: Patent lower extremity arterial system without evidence of  hemodynamically significant stenosis.     ASSESSMENT/PLAN:: 80 y.o. male here for follow up for RLE thrombectomy of CFA, profunda, SFA, popliteal and tibial arteries, left iliac artery, CFA, profunda, SFA thrombectomy and bilateral 4 compartment fasciotomies on 08/22/2020.  Subsequently, he underwent Sharp excisional debridement of left calf wound (17x7x2cm total volume). Sharp excisional debridement of right calf wound (16x6x2cm total volume). Initiation of negative pressure therapy to bilateral lower extremity wounds on 09/07/2020 by Dr. Stanford Breed.   This was felt to be a thromboembolic event and was put on Eliquis.    BLE ischemia -pt seen today for complaints of LLE pain.  Upon speaking with pt, the pain comes and goes and has been like this since he was discharged from the hospital.  He does have palpable PT pulse on the left with normal ABI and toe pressure.  His right DP pulse is palpable as well.  LLE arterial  duplex without evidence of hemodynamically significant stenosis.  Discussed with he and his daughter this is likely residual from his event last year given he was without blood flow due to a thromboembolic event.  He should continue his Eliquis.   -discussed to continue a graduated walking program.  Current smoker -discussed importance of smoking cessation.  He tells me he will work on it.   -pt will f/u in 6 months with BLE arterial duplex and ABI.  Discussed with he and his daughter to call sooner should he develop rest pain or non healing wounds.     Leontine Locket, [facility] [phone]  Clinic MD:   Carlis Abbott

## 2021-08-29 ENCOUNTER — Other Ambulatory Visit: Payer: Self-pay

## 2021-08-29 DIAGNOSIS — I998 Other disorder of circulatory system: Secondary | ICD-10-CM
